# Patient Record
Sex: Female | Born: 1980 | Race: White | Hispanic: No | Marital: Single | Smoking: Former smoker
Health system: Southern US, Community
[De-identification: ages and names within clinical notes are randomized; demographics above are authoritative.]

## PROBLEM LIST (undated history)

## (undated) DIAGNOSIS — Z8742 Personal history of other diseases of the female genital tract: Secondary | ICD-10-CM

## (undated) DIAGNOSIS — E785 Hyperlipidemia, unspecified: Secondary | ICD-10-CM

## (undated) DIAGNOSIS — G8929 Other chronic pain: Secondary | ICD-10-CM

## (undated) DIAGNOSIS — F1193 Opioid use, unspecified with withdrawal: Secondary | ICD-10-CM

## (undated) DIAGNOSIS — R569 Unspecified convulsions: Secondary | ICD-10-CM

## (undated) DIAGNOSIS — E282 Polycystic ovarian syndrome: Secondary | ICD-10-CM

## (undated) DIAGNOSIS — M542 Cervicalgia: Secondary | ICD-10-CM

## (undated) DIAGNOSIS — N739 Female pelvic inflammatory disease, unspecified: Secondary | ICD-10-CM

## (undated) DIAGNOSIS — F419 Anxiety disorder, unspecified: Secondary | ICD-10-CM

## (undated) HISTORY — DX: Female pelvic inflammatory disease, unspecified: N73.9

## (undated) HISTORY — DX: Hyperlipidemia, unspecified: E78.5

## (undated) HISTORY — DX: Polycystic ovarian syndrome: E28.2

## (undated) HISTORY — DX: Unspecified convulsions: R56.9

## (undated) HISTORY — DX: Personal history of other diseases of the female genital tract: Z87.42

## (undated) HISTORY — PX: OTHER SURGICAL HISTORY: SHX169

---
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
## 2001-02-21 ENCOUNTER — Other Ambulatory Visit: Admission: RE | Admit: 2001-02-21 | Discharge: 2001-02-21 | Payer: Self-pay | Admitting: Obstetrics and Gynecology

## 2002-03-27 ENCOUNTER — Other Ambulatory Visit: Admission: RE | Admit: 2002-03-27 | Discharge: 2002-03-27 | Payer: Self-pay | Admitting: Obstetrics and Gynecology

## 2002-05-12 ENCOUNTER — Encounter (INDEPENDENT_AMBULATORY_CARE_PROVIDER_SITE_OTHER): Payer: Self-pay | Admitting: Specialist

## 2002-05-12 ENCOUNTER — Ambulatory Visit (HOSPITAL_COMMUNITY): Admission: RE | Admit: 2002-05-12 | Discharge: 2002-05-12 | Payer: Self-pay | Admitting: Obstetrics and Gynecology

## 2002-12-18 ENCOUNTER — Other Ambulatory Visit: Admission: RE | Admit: 2002-12-18 | Discharge: 2002-12-18 | Payer: Self-pay | Admitting: Obstetrics and Gynecology

## 2003-08-03 ENCOUNTER — Other Ambulatory Visit: Admission: RE | Admit: 2003-08-03 | Discharge: 2003-08-03 | Payer: Self-pay | Admitting: Obstetrics and Gynecology

## 2004-01-05 ENCOUNTER — Ambulatory Visit: Payer: Self-pay | Admitting: Internal Medicine

## 2004-01-22 ENCOUNTER — Ambulatory Visit: Payer: Self-pay | Admitting: Family Medicine

## 2004-03-01 ENCOUNTER — Ambulatory Visit: Payer: Self-pay | Admitting: Family Medicine

## 2004-03-14 ENCOUNTER — Other Ambulatory Visit: Admission: RE | Admit: 2004-03-14 | Discharge: 2004-03-14 | Payer: Self-pay | Admitting: Obstetrics and Gynecology

## 2004-03-15 ENCOUNTER — Other Ambulatory Visit: Admission: RE | Admit: 2004-03-15 | Discharge: 2004-03-15 | Payer: Self-pay | Admitting: Obstetrics and Gynecology

## 2004-04-19 ENCOUNTER — Ambulatory Visit (HOSPITAL_COMMUNITY): Admission: AD | Admit: 2004-04-19 | Discharge: 2004-04-19 | Payer: Self-pay | Admitting: Obstetrics & Gynecology

## 2004-04-19 ENCOUNTER — Encounter (INDEPENDENT_AMBULATORY_CARE_PROVIDER_SITE_OTHER): Payer: Self-pay | Admitting: *Deleted

## 2004-06-13 ENCOUNTER — Ambulatory Visit: Payer: Self-pay | Admitting: Family Medicine

## 2004-08-18 ENCOUNTER — Ambulatory Visit (HOSPITAL_COMMUNITY): Admission: RE | Admit: 2004-08-18 | Discharge: 2004-08-18 | Payer: Self-pay | Admitting: Family Medicine

## 2004-09-19 ENCOUNTER — Ambulatory Visit: Payer: Self-pay | Admitting: Family Medicine

## 2004-09-27 ENCOUNTER — Ambulatory Visit: Payer: Self-pay | Admitting: Family Medicine

## 2004-10-21 ENCOUNTER — Ambulatory Visit: Payer: Self-pay | Admitting: Family Medicine

## 2004-11-12 ENCOUNTER — Emergency Department (HOSPITAL_COMMUNITY): Admission: EM | Admit: 2004-11-12 | Discharge: 2004-11-12 | Payer: Self-pay | Admitting: Family Medicine

## 2005-01-01 ENCOUNTER — Emergency Department (HOSPITAL_COMMUNITY): Admission: EM | Admit: 2005-01-01 | Discharge: 2005-01-01 | Payer: Self-pay | Admitting: Emergency Medicine

## 2005-01-04 ENCOUNTER — Ambulatory Visit: Payer: Self-pay | Admitting: Family Medicine

## 2005-01-18 ENCOUNTER — Emergency Department (HOSPITAL_COMMUNITY): Admission: EM | Admit: 2005-01-18 | Discharge: 2005-01-18 | Payer: Self-pay | Admitting: Family Medicine

## 2005-01-23 ENCOUNTER — Ambulatory Visit: Payer: Self-pay | Admitting: Pain Medicine

## 2005-02-06 HISTORY — PX: CERVICAL BIOPSY  W/ LOOP ELECTRODE EXCISION: SUR135

## 2005-02-21 ENCOUNTER — Ambulatory Visit: Payer: Self-pay | Admitting: Pain Medicine

## 2005-03-04 ENCOUNTER — Emergency Department (HOSPITAL_COMMUNITY): Admission: EM | Admit: 2005-03-04 | Discharge: 2005-03-04 | Payer: Self-pay | Admitting: Emergency Medicine

## 2005-03-16 ENCOUNTER — Ambulatory Visit: Payer: Self-pay | Admitting: Family Medicine

## 2005-05-21 ENCOUNTER — Emergency Department: Payer: Self-pay | Admitting: Unknown Physician Specialty

## 2005-05-24 ENCOUNTER — Emergency Department: Payer: Self-pay | Admitting: Internal Medicine

## 2005-06-07 ENCOUNTER — Encounter: Payer: Self-pay | Admitting: Emergency Medicine

## 2005-07-23 ENCOUNTER — Emergency Department (HOSPITAL_COMMUNITY): Admission: EM | Admit: 2005-07-23 | Discharge: 2005-07-23 | Payer: Self-pay | Admitting: Emergency Medicine

## 2005-10-18 ENCOUNTER — Inpatient Hospital Stay (HOSPITAL_COMMUNITY): Admission: AD | Admit: 2005-10-18 | Discharge: 2005-10-18 | Payer: Self-pay | Admitting: Obstetrics & Gynecology

## 2006-04-10 ENCOUNTER — Inpatient Hospital Stay (HOSPITAL_COMMUNITY): Admission: RE | Admit: 2006-04-10 | Discharge: 2006-04-13 | Payer: Self-pay | Admitting: Obstetrics and Gynecology

## 2006-05-02 ENCOUNTER — Encounter: Admission: RE | Admit: 2006-05-02 | Discharge: 2006-06-01 | Payer: Self-pay | Admitting: Obstetrics and Gynecology

## 2006-06-02 ENCOUNTER — Encounter: Admission: RE | Admit: 2006-06-02 | Discharge: 2006-07-01 | Payer: Self-pay | Admitting: Obstetrics and Gynecology

## 2006-07-02 ENCOUNTER — Encounter: Admission: RE | Admit: 2006-07-02 | Discharge: 2006-08-01 | Payer: Self-pay | Admitting: Obstetrics and Gynecology

## 2006-08-02 ENCOUNTER — Encounter: Admission: RE | Admit: 2006-08-02 | Discharge: 2006-08-31 | Payer: Self-pay | Admitting: Obstetrics and Gynecology

## 2006-09-01 ENCOUNTER — Encounter: Admission: RE | Admit: 2006-09-01 | Discharge: 2006-10-01 | Payer: Self-pay | Admitting: Obstetrics and Gynecology

## 2006-10-02 ENCOUNTER — Encounter: Admission: RE | Admit: 2006-10-02 | Discharge: 2006-11-01 | Payer: Self-pay | Admitting: Obstetrics and Gynecology

## 2006-10-24 ENCOUNTER — Encounter: Payer: Self-pay | Admitting: Family Medicine

## 2006-11-02 ENCOUNTER — Encounter: Admission: RE | Admit: 2006-11-02 | Discharge: 2006-12-01 | Payer: Self-pay | Admitting: Obstetrics and Gynecology

## 2006-12-02 ENCOUNTER — Encounter: Admission: RE | Admit: 2006-12-02 | Discharge: 2007-01-01 | Payer: Self-pay | Admitting: Obstetrics and Gynecology

## 2006-12-11 ENCOUNTER — Encounter: Payer: Self-pay | Admitting: Family Medicine

## 2006-12-12 ENCOUNTER — Ambulatory Visit: Payer: Self-pay | Admitting: Family Medicine

## 2006-12-12 DIAGNOSIS — E663 Overweight: Secondary | ICD-10-CM | POA: Insufficient documentation

## 2007-01-02 ENCOUNTER — Encounter: Admission: RE | Admit: 2007-01-02 | Discharge: 2007-01-31 | Payer: Self-pay | Admitting: Obstetrics and Gynecology

## 2007-01-10 ENCOUNTER — Encounter (INDEPENDENT_AMBULATORY_CARE_PROVIDER_SITE_OTHER): Payer: Self-pay | Admitting: *Deleted

## 2007-01-11 ENCOUNTER — Encounter (INDEPENDENT_AMBULATORY_CARE_PROVIDER_SITE_OTHER): Payer: Self-pay | Admitting: *Deleted

## 2007-02-01 ENCOUNTER — Encounter: Admission: RE | Admit: 2007-02-01 | Discharge: 2007-03-03 | Payer: Self-pay | Admitting: Obstetrics and Gynecology

## 2007-03-30 ENCOUNTER — Emergency Department (HOSPITAL_COMMUNITY): Admission: EM | Admit: 2007-03-30 | Discharge: 2007-03-30 | Payer: Self-pay | Admitting: Emergency Medicine

## 2009-03-08 ENCOUNTER — Ambulatory Visit: Payer: Self-pay | Admitting: Pain Medicine

## 2009-03-10 ENCOUNTER — Ambulatory Visit: Payer: Self-pay | Admitting: Pain Medicine

## 2009-03-23 ENCOUNTER — Ambulatory Visit: Payer: Self-pay | Admitting: Pain Medicine

## 2009-04-12 ENCOUNTER — Ambulatory Visit: Payer: Self-pay | Admitting: Pain Medicine

## 2010-06-24 NOTE — Discharge Summary (Signed)
NAMEERIAN, LARIVIERE NO.:  [id number]   MEDICAL RECORD NO.:  [id number]          PATIENT TYPE:  INP   LOCATION:  9107                          FACILITY:  [facility]   PHYSICIAN:  Malva Limes, M.D.    DATE OF BIRTH:  December 29, 1980   DATE OF ADMISSION:  04/10/2006  DATE OF DISCHARGE:  04/13/2006                               DISCHARGE SUMMARY   FINAL DIAGNOSES:  1. Intrauterine pregnancy at term.  2. Breech presentation.  3. Herpes simplex virus, type 2.   PROCEDURE:  Primary low transverse cesarean section.   SURGEON:  Carrington Clamp, M.D.   COMPLICATIONS:  None.   This 30 year old, G3, P0-0-1-0, presents at term for cesarean section  secondary to a known breech presentation.  This was confirmed by  ultrasound at bedside before the surgery. Her antepartum course up to  this point had been mostly uncomplicated.  She does have HSV, type 2,  and had been on Valtrex during her last trimester. She had a positive  group B strep culture obtained in our office at 36 weeks.  The patient  was taking to the operating room on April 10, 2006, by Dr. Carrington Clamp where the patient had a primary low transverse cesarean section  with the delivery of a 7 pound 0 ounce female infant with Apgars of  eight and nine.  This was performed for a known breech presentation  dictation. The delivery without complications.   The patient's postoperative course was benign without any significant  fevers. The patient was felt ready for discharge on postoperative day  #3. She was having some depressive symptoms.  She had been on Prozac  prior to the pregnancy, and, after discussing this with Dr. Dareen Piano,  decided she wanted to restart this.  He gave her a prescription for  Prozac 20 mg to start taking daily. Of course to call with any other  depressive symptoms, was also given Percocet one to two every 4-6 hours  as needed for the pain, continue her vitamins, and was to follow up in  our  office in 2 weeks to recheck or sooner if needed.   LABORATORY DATA ON DISCHARGE:  The patient had a hemoglobin of 11.2,  white blood cell count of 9.4, platelets of 175,000.      Leilani Able, P.A.-C.    ______________________________  Malva Limes, M.D.    MB/MEDQ  D:  05/09/2006  T:  05/09/2006  Job:  [id number]

## 2010-06-24 NOTE — Op Note (Signed)
[name], GROSS NO.:  [id number]   MEDICAL RECORD NO.:  [id number]          PATIENT TYPE:  INP   LOCATION:  9107                          FACILITY:  [facility]   PHYSICIAN:  Carrington Clamp, M.D. DATE OF BIRTH:  [birth year]   DATE OF PROCEDURE:  04/10/2006  DATE OF DISCHARGE:                               OPERATIVE REPORT   PREOPERATIVE DIAGNOSIS:  Term breech.   POSTOPERATIVE DIAGNOSIS:  Term breech.   PROCEDURE:  Primary low transverse cesarean section.   SURGEON:  Dr. Carrington Clamp   ASSISTANT:  None.   ANESTHESIA:  Spinal.   SPECIMENS:  None.   ESTIMATED BLOOD LOSS:  800 mL.   IV FLUIDS:  2800 mL.   URINE OUTPUT:  200 mL.   COMPLICATIONS:  None.   FINDINGS:  A female infant in the breech presentation; Apgars 8 and 9;  normal tubes, ovaries and uterus seen.   MEDICATIONS:  Pitocin and Ancef and 0.5% Marcaine.   COUNTS:  Correct x3.   TECHNIQUE:  After adequate spinal anesthesia was achieved, the patient  was prepped and draped in usual sterile fashion, dorsal supine position  with leftward tilt.  Then 2.5 mL of 0.5% Marcaine were injected into the  incision area to ensure adequate pain control.  A Pfannenstiel skin  incision was made with the scalpel and carried down to the fascia with  the Bovie cautery.  The fascia was incised in the midline with the  scalpel and carried in transverse curvilinear manner with the Mayo  scissors.  The fascia was reflected superiorly and inferiorly from the  rectus muscles and the rectus muscles split in the midline.   Bowel free portion of the peritoneum was entered into bluntly with the  hemostats, and then the peritoneum was incised in a superior and  inferior manner bluntly.  There was good visualization of the bowel and  the bladder.   The bladder blade was placed.  The vesicouterine fascia was tented up  and incised in a transverse curvilinear manner with Metzenbaum scissors.  The bladder flap  was created with blunt dissection and the bladder blade  replaced.  A 2 cm incision was made in the upper portion of the lower  uterine segment until clear fluid was noted on entry into amnion.  The  incision was stretched manually, and the baby was identified in the  footling breech presentation and delivered without complication.  The  baby was bulb suctioned.  The cord was clamped and cut and baby was  handed to awaiting pediatrics.   The cord bloods were obtained and the placenta sent to [facility].  The uterus was then exteriorized, wrapped in wet lap,  and cleared of all debris.  The uterine incision was closed with running  lock stitch of 0 Monocryl.  An imbricating layer of 0 Monocryl was  performed.  An additional stitch of figure-of-eight stitch was used to  ensure hemostasis.  The uterus was then reapproximated in the abdomen  and abdomen cleared of debris with irrigation.  The uterine incision was  reinspected and found to be  hemostatic.  The peritoneum was then closed  with a running stitch of 2-0 Vicryl.  The fascia was closed with running  stitch of 0 Vicryl.  The subcutaneous tissue was rendered hemostatic  with Bovie cautery, and the skin was closed with staples.  The patient  tolerated the procedure well and was returned to recovery room in stable  condition.      Carrington Clamp, M.D.  Electronically Signed     MH/MEDQ  D:  04/10/2006  T:  04/10/2006  Job:  [id number]

## 2010-10-30 ENCOUNTER — Encounter: Payer: Self-pay | Admitting: Family Medicine

## 2010-10-31 LAB — I-STAT 8, (EC8 V) (CONVERTED LAB)
Acid-base deficit: 1
BUN: 5 — ABNORMAL LOW
Bicarbonate: 23.7
Chloride: 105
Glucose, Bld: 112 — ABNORMAL HIGH
Potassium: 3.8
Sodium: 138
TCO2: 25
pCO2, Ven: 40.4 — ABNORMAL LOW
pH, Ven: 7.377 — ABNORMAL HIGH

## 2010-10-31 LAB — URINALYSIS, ROUTINE W REFLEX MICROSCOPIC
Bilirubin Urine: NEGATIVE
Ketones, ur: NEGATIVE
Specific Gravity, Urine: 1.008

## 2010-10-31 LAB — RAPID URINE DRUG SCREEN, HOSP PERFORMED: Cocaine: POSITIVE — AB

## 2010-10-31 LAB — ETHANOL: Alcohol, Ethyl (B): 7

## 2010-10-31 LAB — DIFFERENTIAL
Basophils Absolute: 0
Monocytes Absolute: 0.4
Monocytes Relative: 3
Neutrophils Relative %: 79 — ABNORMAL HIGH

## 2010-10-31 LAB — CBC
HCT: 43.5
MCHC: 33.6
MCV: 81.9
RDW: 13.6

## 2010-10-31 LAB — PREGNANCY, URINE: Preg Test, Ur: NEGATIVE

## 2010-10-31 LAB — BASIC METABOLIC PANEL: Sodium: 137

## 2010-11-03 ENCOUNTER — Ambulatory Visit (INDEPENDENT_AMBULATORY_CARE_PROVIDER_SITE_OTHER): Payer: Self-pay | Admitting: Family Medicine

## 2010-11-03 ENCOUNTER — Encounter: Payer: Self-pay | Admitting: Family Medicine

## 2010-11-03 ENCOUNTER — Other Ambulatory Visit: Payer: Self-pay | Admitting: Family Medicine

## 2010-11-03 VITALS — BP 146/87 | HR 82 | Ht 63.0 in | Wt 175.0 lb

## 2010-11-03 DIAGNOSIS — F419 Anxiety disorder, unspecified: Secondary | ICD-10-CM | POA: Insufficient documentation

## 2010-11-03 DIAGNOSIS — R5383 Other fatigue: Secondary | ICD-10-CM | POA: Insufficient documentation

## 2010-11-03 DIAGNOSIS — E663 Overweight: Secondary | ICD-10-CM

## 2010-11-03 DIAGNOSIS — F411 Generalized anxiety disorder: Secondary | ICD-10-CM

## 2010-11-03 DIAGNOSIS — R5381 Other malaise: Secondary | ICD-10-CM

## 2010-11-03 DIAGNOSIS — E785 Hyperlipidemia, unspecified: Secondary | ICD-10-CM | POA: Insufficient documentation

## 2010-11-03 MED ORDER — ALPRAZOLAM 1 MG PO TABS: 1.0000 mg | ORAL_TABLET | Freq: Every day | ORAL | Status: DC | PRN

## 2010-11-03 MED ORDER — PHENTERMINE HCL 37.5 MG PO CAPS: 37.5000 mg | ORAL_CAPSULE | ORAL | Status: DC

## 2010-11-03 MED ORDER — CITALOPRAM HYDROBROMIDE 40 MG PO TABS: 40.0000 mg | ORAL_TABLET | Freq: Every day | ORAL | Status: DC

## 2010-11-03 NOTE — Assessment & Plan Note (Signed)
Obesity . Will try phenteramine 37.5 1 po a day Return for PE and EKG in 1 month and metabolic lab Discussed about genetic testing as well

## 2010-11-03 NOTE — Assessment & Plan Note (Signed)
Renew xanax she only average once a day Recommend we cap the celexa at 40 mg po day

## 2010-11-03 NOTE — Progress Notes (Signed)
  Subjective:    Patient ID: Chelsea Hayes, female    DOB: 1980-04-10, 30 y.o.   MRN: [id number]  HPI  Patient is here for evaluation of her obesity. She states that her celexa does not seem to work any more. She had used cphenteramine years ago but realizes that she was not ready to exercise and modify her eating habits. She promises me she works out on her gym equipment 4-5 days a week burt has not been able to lose her truncal obesity.  Review of Systems  Constitutional: Positive for fatigue.  Respiratory: Negative.   Cardiovascular: Negative.   Psychiatric/Behavioral: The patient is nervous/anxious.    BP 146/87  Pulse 82  Ht 5\' 3"  (1.6 m)  Wt 175 lb (79.379 kg)  BMI 31.00 kg/m2 Allergies  Allergen Reactions  . Propoxyphene N-Acetaminophen     REACTION: nausea   History   Social History  . Marital Status: Single    Spouse Name: N/A    Number of Children: N/A  . Years of Education: N/A   Occupational History  . Not on file.   Social History Main Topics  . Smoking status: Current Everyday Smoker -- 0.5 packs/day    Types: Cigarettes  . Smokeless tobacco: Not on file  . Alcohol Use: Yes     occasional  . Drug Use: No  . 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   Other Topics Concern  . Not on file   Social History Narrative  . No narrative on file   Outpatient Encounter Prescriptions as of 11/03/2010  Medication Sig Dispense Refill  . ALPRAZolam (XANAX) 1 MG tablet Take 1 tablet (1 mg total) by mouth daily as needed.  90 tablet  1  . citalopram (CELEXA) 40 MG tablet Take 1 tablet (40 mg total) by mouth daily.  90 tablet  3  . DISCONTD: ALPRAZolam (XANAX) 1 MG tablet Take 1 mg by mouth 3 (three) times daily as needed.        Marland Kitchen DISCONTD: citalopram (CELEXA) 20 MG tablet Take 20 mg by mouth 3 (three) times daily.        Marland Kitchen levothyroxine (SYNTHROID, LEVOTHROID) 25 MCG tablet Take 25 mcg by mouth daily.        . Multiple  Vitamin (MULTIVITAMIN) tablet Take 1 tablet by mouth daily.        . phentermine 37.5 MG capsule Take 1 capsule (37.5 mg total) by mouth every morning.  30 capsule  0       Objective:   Physical Exam  Constitutional: She appears well-developed and well-nourished.       Obese Wf  HENT:  Head: Normocephalic and atraumatic.  Neck: Normal range of motion. Neck supple.  Cardiovascular: Normal rate, regular rhythm and normal heart sounds.   Pulmonary/Chest: Effort normal and breath sounds normal.          Assessment & Plan:  Patient has decline flu vacccine

## 2010-11-03 NOTE — Patient Instructions (Signed)
Return in 1 month for PE and EKG. Continue to exercise on a regular basis.

## 2010-11-03 NOTE — Assessment & Plan Note (Signed)
Will get metabolic labs TSH , Hga1C, and lipids.

## 2010-11-03 NOTE — Telephone Encounter (Signed)
Pharmacy/[facility] calling to see if they can change pt phentermine from caps to tablets since they do not have in stock,  There could possibly be a absorption diff with the tablets.   PLan:  Instructed pharmacy to either order for the pt or call another [facility] to see if they can get the caps for the pt. Chelsea Newcomer, LPN Domingo Dimes

## 2010-11-15 ENCOUNTER — Encounter: Payer: Self-pay | Admitting: Family Medicine

## 2010-11-15 ENCOUNTER — Ambulatory Visit (INDEPENDENT_AMBULATORY_CARE_PROVIDER_SITE_OTHER): Payer: Self-pay | Admitting: Family Medicine

## 2010-11-15 VITALS — BP 135/83 | HR 73 | Ht 63.0 in | Wt 180.0 lb

## 2010-11-15 DIAGNOSIS — F329 Major depressive disorder, single episode, unspecified: Secondary | ICD-10-CM

## 2010-11-15 DIAGNOSIS — G8921 Chronic pain due to trauma: Secondary | ICD-10-CM | POA: Insufficient documentation

## 2010-11-15 DIAGNOSIS — M549 Dorsalgia, unspecified: Secondary | ICD-10-CM

## 2010-11-15 MED ORDER — METHADONE HCL 10 MG PO TABS: 20.0000 mg | ORAL_TABLET | Freq: Three times a day (TID) | ORAL | Status: DC

## 2010-11-15 MED ORDER — DULOXETINE HCL 30 MG PO CPEP: 60.0000 mg | ORAL_CAPSULE | Freq: Every day | ORAL | Status: DC

## 2010-11-15 NOTE — Progress Notes (Signed)
Subjective:    Patient ID: Chelsea Hayes, female    DOB: Mar 14, 1980, 30 y.o.   MRN: [id number]  Back Pain This is a chronic problem. The current episode started in the past 7 days. The problem occurs 2 to 4 times per day. The problem has been waxing and waning since onset. The pain is present in the gluteal and sacro-iliac (Cspine as well). The quality of the pain is described as aching and stabbing. The symptoms are aggravated by bending, sitting, standing and position. Stiffness is present in the morning and all day. Risk factors include sedentary lifestyle, lack of exercise and obesity. She has tried analgesics, NSAIDs, muscle relaxant and home exercises for the symptoms. The treatment provided no relief.   Patient reports taking MEthadone from pain clinic. When she lived in [place] she got all her medication at one place and that is what she wants to do now. She has an appointment for a PE later this month.   Review of Systems  Musculoskeletal: Positive for back pain.   BP 135/83  Pulse 73  Ht 5\' 3"  (1.6 m)  Wt 180 lb (81.647 kg)  BMI 31.89 kg/m2  SpO2 96% Allergies  Allergen Reactions  . Propoxyphene N-Acetaminophen     REACTION: nausea  . Codeine    History   Social History  . Marital Status: Single    Spouse Name: N/A    Number of Children: N/A  . Years of Education: N/A   Occupational History  . Not on file.   Social History Main Topics  . Smoking status: Former Smoker -- 0.5 packs/day    Types: Cigarettes  . Smokeless tobacco: Not on file  . Alcohol Use: Yes     occasional  . Drug Use: No  . 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   Other Topics Concern  . Not on file   Social History Narrative  . No narrative on file        Objective:   Physical Exam  Constitutional: She is oriented to person, place, and time. She appears well-developed and well-nourished.  Cardiovascular: Normal rate.     Musculoskeletal: She exhibits tenderness.       Cervical back: She exhibits tenderness, pain and spasm.       Lumbar back: She exhibits tenderness, pain and spasm.  Neurological: She is alert and oriented to person, place, and time.  Skin: Skin is warm.          Assessment & Plan:  Due to chronic pain will add cymbalta to pain regimen and wean off celexa. Start cymbalta at 30mg  reduce celexa in half after 2 weks stop celexa and increase cymbalta to 2 capsules a day or 60 mg  follow up w/me as schedule end of the month. Outpatient Encounter Prescriptions as of 11/15/2010  Medication Sig Dispense Refill  . ALPRAZolam (XANAX) 1 MG tablet Take 1 tablet (1 mg total) by mouth daily as needed.  90 tablet  1  . citalopram (CELEXA) 40 MG tablet Take 1 tablet (40 mg total) by mouth daily.  90 tablet  3  . methadone (DOLOPHINE) 10 MG tablet Take 2 tablets (20 mg total) by mouth every 8 (eight) hours.  180 tablet  0  . Multiple Vitamin (MULTIVITAMIN) tablet Take 1 tablet by mouth daily.        . phentermine 37.5 MG capsule Take 1 capsule (37.5 mg total) by mouth  every morning.  30 capsule  0  . DISCONTD: methadone (DOLOPHINE) 10 MG tablet Take 10 mg by mouth daily.        . DULoxetine (CYMBALTA) 30 MG capsule Take 2 capsules (60 mg total) by mouth daily. Ist 2 weeks take 1 a day and 1/2 celexa dosage after 2 weeks increase to twice a day and stop celexa  60 capsule  2  . levothyroxine (SYNTHROID, LEVOTHROID) 25 MCG tablet Take 25 mcg by mouth daily.

## 2010-11-15 NOTE — Assessment & Plan Note (Signed)
Due to chronic pain will add cymbalta to pain regimen and wean off celexa. Start cymbalta at 30mg reduce celexa in half after 2 weks stop celexa and increase cymbalta to 2 capsules a day or 60 mg  follow up w/me as schedule end of the month. 

## 2010-11-15 NOTE — Patient Instructions (Signed)
Due to chronic pain will add cymbalta to pain regimen and wean off celexa. Start cymbalta at 30mg  reduce celexa in half after 2 weks stop celexa and increase cymbalta to 2 capsules a day or 60 mg  follow up w/me as schedule end of the month.

## 2010-12-01 ENCOUNTER — Encounter: Payer: Self-pay | Admitting: Family Medicine

## 2010-12-13 ENCOUNTER — Other Ambulatory Visit: Payer: Self-pay | Admitting: *Deleted

## 2010-12-13 MED ORDER — METHADONE HCL 10 MG PO TABS: 20.0000 mg | ORAL_TABLET | Freq: Three times a day (TID) | ORAL | Status: DC

## 2011-01-09 ENCOUNTER — Other Ambulatory Visit: Payer: Self-pay | Admitting: *Deleted

## 2011-01-09 NOTE — Telephone Encounter (Signed)
Dr. Thurmond Butts can you refill this for her

## 2011-01-09 NOTE — Telephone Encounter (Signed)
Pt calls and would like a refill on her Methadone. Her husbands father passed away and they will be traveling to [place] in the morning and would like to pick up today if possible

## 2011-01-09 NOTE — Telephone Encounter (Signed)
I don't rx methadone.  She will have to wait until Dr. Thurmond Butts is back.

## 2011-01-10 MED ORDER — METHADONE HCL 10 MG PO TABS: 20.0000 mg | ORAL_TABLET | Freq: Three times a day (TID) | ORAL | Status: DC

## 2011-01-10 NOTE — Telephone Encounter (Signed)
I have some reservation since she was to have a PE at the the end of October and then did not return in November . She may have 30 but no more until she has that complete PE.

## 2011-01-12 ENCOUNTER — Ambulatory Visit (INDEPENDENT_AMBULATORY_CARE_PROVIDER_SITE_OTHER): Payer: Self-pay | Admitting: Family Medicine

## 2011-01-12 ENCOUNTER — Encounter: Payer: Self-pay | Admitting: Family Medicine

## 2011-01-12 VITALS — BP 137/88 | HR 86 | Ht 63.0 in | Wt 181.0 lb

## 2011-01-12 DIAGNOSIS — Z0000 Encounter for general adult medical examination without abnormal findings: Secondary | ICD-10-CM

## 2011-01-12 DIAGNOSIS — F329 Major depressive disorder, single episode, unspecified: Secondary | ICD-10-CM

## 2011-01-12 DIAGNOSIS — Z283 Underimmunization status: Secondary | ICD-10-CM

## 2011-01-12 DIAGNOSIS — E669 Obesity, unspecified: Secondary | ICD-10-CM

## 2011-01-12 DIAGNOSIS — E785 Hyperlipidemia, unspecified: Secondary | ICD-10-CM

## 2011-01-12 DIAGNOSIS — E663 Overweight: Secondary | ICD-10-CM

## 2011-01-12 DIAGNOSIS — G8929 Other chronic pain: Secondary | ICD-10-CM

## 2011-01-12 DIAGNOSIS — M549 Dorsalgia, unspecified: Secondary | ICD-10-CM

## 2011-01-12 LAB — POCT URINALYSIS DIPSTICK
Bilirubin, UA: NEGATIVE
Glucose, UA: NEGATIVE
Leukocytes, UA: NEGATIVE
Nitrite, UA: NEGATIVE
Protein, UA: NEGATIVE
Spec Grav, UA: >=1.03
Urobilinogen, UA: 0.2
pH, UA: 5.5

## 2011-01-12 MED ORDER — PHENTERMINE HCL 37.5 MG PO CAPS: 37.5000 mg | ORAL_CAPSULE | ORAL | Status: DC

## 2011-01-12 MED ORDER — METHADONE HCL 10 MG PO TABS: 10.0000 mg | ORAL_TABLET | Freq: Three times a day (TID) | ORAL | Status: DC

## 2011-01-12 NOTE — Patient Instructions (Signed)
obesityHypertriglyceridemia  Diet for High blood levels of Triglycerides Most fats in food are triglycerides. Triglycerides in your blood are stored as fat in your body. High levels of triglycerides in your blood may put you at a greater risk for heart disease and stroke.  Normal triglyceride levels are less than 150 mg/dL. Borderline high levels are 150-199 mg/dl. High levels are 200 - 499 mg/dL, and very high triglyceride levels are greater than 500 mg/dL. The decision to treat high triglycerides is generally based on the level. For people with borderline or high triglyceride levels, treatment includes weight loss and exercise. Drugs are recommended for people with very high triglyceride levels. Many people who need treatment for high triglyceride levels have metabolic syndrome. This syndrome is a collection of disorders that often include: insulin resistance, high blood pressure, blood clotting problems, high cholesterol and triglycerides. TESTING PROCEDURE FOR TRIGLYCERIDES You should not eat 4 hours before getting your triglycerides measured. The normal range of triglycerides is between 10 and 250 milligrams per deciliter (mg/dl). Some people may have extreme levels (1000 or above), but your triglyceride level may be too high if it is above 150 mg/dl, depending on what other risk factors you have for heart disease.  People with high blood triglycerides may also have high blood cholesterol levels. If you have high blood cholesterol as well as high blood triglycerides, your risk for heart disease is probably greater than if you only had high triglycerides. High blood cholesterol is one of the main risk factors for heart disease.  CHANGING YOUR DIET  Your weight can affect your blood triglyceride level. If you are more than 20% above your ideal body weight, you may be able to lower your blood triglycerides by losing weight. Eating less and exercising regularly is the best way to combat this. Fat provides  more calories than any other food. The best way to lose weight is to eat less fat. Only 30% of your total calories should come from fat. Less than 7% of your diet should come from saturated fat. A diet low in fat and saturated fat is the same as a diet to decrease blood cholesterol. By eating a diet lower in fat, you may lose weight, lower your blood cholesterol, and lower your blood triglyceride level.  Eating a diet low in fat, especially saturated fat, may also help you lower your blood triglyceride level. Ask your dietitian to help you figure how much fat you can eat based on the number of calories your caregiver has prescribed for you.  Exercise, in addition to helping with weight loss may also help lower triglyceride levels.  Alcohol can increase blood triglycerides. You may need to stop drinking alcoholic beverages.  Too much carbohydrate in your diet may also increase your blood triglycerides. Some complex carbohydrates are necessary in your diet. These may include bread, rice, potatoes, other starchy vegetables and cereals.  Reduce "simple" carbohydrates. These may include pure sugars, candy, honey, and jelly without losing other nutrients. If you have the kind of high blood triglycerides that is affected by the amount of carbohydrates in your diet, you will need to eat less sugar and less high-sugar foods. Your caregiver can help you with this.  Adding 2-4 grams of fish oil (EPA+ DHA) may also help lower triglycerides. Speak with your caregiver before adding any supplements to your regimen.  Following the Diet  Maintain your ideal weight. Your caregivers can help you with a diet. Generally, eating less food and getting more  exercise will help you lose weight. Joining a weight control group may also help. Ask your caregivers for a good weight control group in your area.  Eat low-fat foods instead of high-fat foods. This can help you lose weight too.  These foods are lower in fat. Eat MORE of  these:  Dried beans, peas, and lentils.  Egg whites.  Low-fat cottage cheese.  Fish.  Lean cuts of meat, such as round, sirloin, rump, and flank (cut extra fat off meat you fix).  Whole grain breads, cereals and pasta.  Skim and nonfat dry milk.  Low-fat yogurt.  Poultry without the skin.  Cheese made with skim or part-skim milk, such as mozzarella, parmesan, farmers', ricotta, or pot cheese.  These are higher fat foods. Eat LESS of these:  Whole milk and foods made from whole milk, such as American, blue, cheddar, monterey jack, and swiss cheese  High-fat meats, such as luncheon meats, sausages, knockwurst, bratwurst, hot dogs, ribs, corned beef, ground pork, and regular ground beef.  Fried foods.  Limit saturated fats in your diet. Substituting unsaturated fat for saturated fat may decrease your blood triglyceride level. You will need to read package labels to know which products contain saturated fats.  These foods are high in saturated fat. Eat LESS of these:  Fried pork skins.  Whole milk.  Skin and fat from poultry.  Palm oil.  Butter.  Shortening.  Cream cheese.  Tomasa Blase.  Margarines and baked goods made from listed oils.  Vegetable shortenings.  Chitterlings.  Fat from meats.  Coconut oil.  Palm kernel oil.  Lard.  Cream.  Sour cream.  Fatback.  Coffee whiteners and non-dairy creamers made with these oils.  Cheese made from whole milk.  Use unsaturated fats (both polyunsaturated and monounsaturated) moderately. Remember, even though unsaturated fats are better than saturated fats; you still want a diet low in total fat.  These foods are high in unsaturated fat:  Canola oil.  Sunflower oil.  Mayonnaise.  Almonds.  Peanuts.  Pine nuts.  Margarines made with these oils.  Safflower oil.  Olive oil.  Avocados.  Cashews.  Peanut butter.  Sunflower seeds.  Soybean oil.  Peanut oil.  Olives.  Pecans.  Walnuts.  Pumpkin seeds.  Avoid sugar and other high-sugar  foods. This will decrease carbohydrates without decreasing other nutrients. Sugar in your food goes rapidly to your blood. When there is excess sugar in your blood, your liver may use it to make more triglycerides. Sugar also contains calories without other important nutrients.  Eat LESS of these:  Sugar, brown sugar, powdered sugar, jam, jelly, preserves, honey, syrup, molasses, pies, candy, cakes, cookies, frosting, pastries, colas, soft drinks, punches, fruit drinks, and regular gelatin.  Avoid alcohol. Alcohol, even more than sugar, may increase blood triglycerides. In addition, alcohol is high in calories and low in nutrients. Ask for sparkling water, or a diet soft drink instead of an alcoholic beverage.  Suggestions for planning and preparing meals  Bake, broil, grill or roast meats instead of frying.  Remove fat from meats and skin from poultry before cooking.  Add spices, herbs, lemon juice or vinegar to vegetables instead of salt, rich sauces or gravies.  Use a non-stick skillet without fat or use no-stick sprays.  Cool and refrigerate stews and broth. Then remove the hardened fat floating on the surface before serving.  Refrigerate meat drippings and skim off fat to make low-fat gravies.  Serve more fish.  Use less butter, margarine  and other high-fat spreads on bread or vegetables.  Use skim or reconstituted non-fat dry milk for cooking.  Cook with low-fat cheeses.  Substitute low-fat yogurt or cottage cheese for all or part of the sour cream in recipes for sauces, dips or congealed salads.  Use half yogurt/half mayonnaise in salad recipes.  Substitute evaporated skim milk for cream. Evaporated skim milk or reconstituted non-fat dry milk can be whipped and substituted for whipped cream in certain recipes.  Choose fresh fruits for dessert instead of high-fat foods such as pies or cakes. Fruits are naturally low in fat.  When Dining Out  Order low-fat appetizers such as fruit or  vegetable juice, pasta with vegetables or tomato sauce.  Select clear, rather than cream soups.  Ask that dressings and gravies be served on the side. Then use less of them.  Order foods that are baked, broiled, poached, steamed, stir-fried, or roasted.  Ask for margarine instead of butter, and use only a small amount.  Drink sparkling water, unsweetened tea or coffee, or diet soft drinks instead of alcohol or other sweet beverages.  QUESTIONS AND ANSWERS ABOUT OTHER FATS IN THE BLOOD: SATURATED FAT, TRANS FAT, AND CHOLESTEROL What is trans fat? Trans fat is a type of fat that is formed when vegetable oil is hardened through a process called hydrogenation. This process helps makes foods more solid, gives them shape, and prolongs their shelf life. Trans fats are also called hydrogenated or partially hydrogenated oils.  What do saturated fat, trans fat, and cholesterol in foods have to do with heart disease? Saturated fat, trans fat, and cholesterol in the diet all raise the level of LDL "bad" cholesterol in the blood. The higher the LDL cholesterol, the greater the risk for coronary heart disease (CHD). Saturated fat and trans fat raise LDL similarly.  What foods contain saturated fat, trans fat, and cholesterol? High amounts of saturated fat are found in animal products, such as fatty cuts of meat, chicken skin, and full-fat dairy products like butter, whole milk, cream, and cheese, and in tropical vegetable oils such as palm, palm kernel, and coconut oil. Trans fat is found in some of the same foods as saturated fat, such as vegetable shortening, some margarines (especially hard or stick margarine), crackers, cookies, baked goods, fried foods, salad dressings, and other processed foods made with partially hydrogenated vegetable oils. Small amounts of trans fat also occur naturally in some animal products, such as milk products, beef, and lamb. Foods high in cholesterol include liver, other organ meats,  egg yolks, shrimp, and full-fat dairy products. How can I use the new food label to make heart-healthy food choices? Check the Nutrition Facts panel of the food label. Choose foods lower in saturated fat, trans fat, and cholesterol. For saturated fat and cholesterol, you can also use the Percent Daily Value (%DV): 5% DV or less is low, and 20% DV or more is high. (There is no %DV for trans fat.) Use the Nutrition Facts panel to choose foods low in saturated fat and cholesterol, and if the trans fat is not listed, read the ingredients and limit products that list shortening or hydrogenated or partially hydrogenated vegetable oil, which tend to be high in trans fat. POINTS TO REMEMBER: YOU NEED A LITTLE TLC (THERAPEUTIC LIFESTYLE CHANGES) Discuss your risk for heart disease with your caregivers, and take steps to reduce risk factors.  Change your diet. Choose foods that are low in saturated fat, trans fat, and cholesterol.  Add  exercise to your daily routine if it is not already being done. Participate in physical activity of moderate intensity, like brisk walking, for at least 30 minutes on most, and preferably all days of the week. No time? Break the 30 minutes into three, 10-minute segments during the day.  Stop smoking. If you do smoke, contact your caregiver to discuss ways in which they can help you quit.  Do not use street drugs.  Maintain a normal weight.  Maintain a healthy blood pressure.  Keep up with your blood work for checking the fats in your blood as directed by your caregiver.  Document Released: 11/11/2003 Document Revised: 10/05/2010 Document Reviewed: 06/08/2008 [facility] Patient Information 2012 [place], [place].Obesity Obesity is defined as having a body mass index (BMI) of 30 or more. To calculate your BMI divide your weight in pounds by your height in inches squared and multiply that product by 703. Major illnesses resulting from long-term obesity include:  Stroke.   Heart  disease.   Diabetes.   Many cancers.   Arthritis.  Obesity also complicates recovery from many other medical problems.  CAUSES   A history of obesity in your parents.   Thyroid hormone imbalance.   Environmental factors such as excess calorie intake and physical inactivity.  TREATMENT  A healthy weight loss program includes:  A calorie restricted diet based on individual calorie needs.   Increased physical activity (exercise).  An exercise program is just as important as the right low-calorie diet.  Weight-loss medicines should be used only under the supervision of your physician. These medicines help, but only if they are used with diet and exercise programs. Medicines can have side effects including nervousness, nausea, abdominal pain, diarrhea, headache, drowsiness, and depression.  An unhealthy weight loss program includes:  Fasting.   Fad diets.   Supplements and drugs.  These choices do not succeed in long-term weight control.  HOME CARE INSTRUCTIONS  To help you make the needed dietary changes:   Exercise and perform physical activity as directed by your caregiver.   Keep a daily record of everything you eat. There are many free websites to help you with this. It may be helpful to measure your foods so you can determine if you are eating the correct portion sizes.   Use low-calorie cookbooks or take special cooking classes.   Avoid alcohol. Drink more water and drinks with no calories.   Take vitamins and supplements only as recommended by your caregiver.   Weight loss support groups, Registered Dieticians, counselors, and stress reduction education can also be very helpful.  Document Released: 03/02/2004 Document Revised: 10/05/2010 Document Reviewed: 12/30/2006 [facility] Patient Information 2012 [place], [place].Exercise to Lose Weight Exercise and a healthy diet may help you lose weight. Your doctor may suggest specific exercises. EXERCISE IDEAS AND  TIPS  Choose low-cost things you enjoy doing, such as walking, bicycling, or exercising to workout videos.   Take stairs instead of the elevator.   Walk during your lunch break.   Park your car further away from work or school.   Go to a gym or an exercise class.   Start with 5 to 10 minutes of exercise each day. Build up to 30 minutes of exercise 4 to 6 days a week.   Wear shoes with good support and comfortable clothes.   Stretch before and after working out.   Work out until you breathe harder and your heart beats faster.   Drink extra water when you exercise.  Do not do so much that you hurt yourself, feel dizzy, or get very short of breath.  Exercises that burn about 150 calories:  Running 1  miles in 15 minutes.   Playing volleyball for 45 to 60 minutes.   Washing and waxing a car for 45 to 60 minutes.   Playing touch football for 45 minutes.   Walking 1  miles in 35 minutes.   Pushing a stroller 1  miles in 30 minutes.   Playing basketball for 30 minutes.   Raking leaves for 30 minutes.   Bicycling 5 miles in 30 minutes.   Walking 2 miles in 30 minutes.   Dancing for 30 minutes.   Shoveling snow for 15 minutes.   Swimming laps for 20 minutes.   Walking up stairs for 15 minutes.   Bicycling 4 miles in 15 minutes.   Gardening for 30 to 45 minutes.   Jumping rope for 15 minutes.   Washing windows or floors for 45 to 60 minutes.  Document Released: 02/25/2010 Document Revised: 10/05/2010 Document Reviewed: 02/25/2010 [facility] Patient Information 2012 [place], [place].
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
## 2011-01-13 ENCOUNTER — Telehealth: Payer: Self-pay | Admitting: *Deleted

## 2011-01-13 DIAGNOSIS — G8929 Other chronic pain: Secondary | ICD-10-CM

## 2011-01-13 NOTE — Telephone Encounter (Signed)
She was right it was to be for 150 more or a total of 180. Does she want to bring that scrip back for a new scrip of 150 or  A 3rd scrip for 120. I will sign the scrip Tuesday when I return.

## 2011-01-13 NOTE — Telephone Encounter (Signed)
Pt calls and states yesterday at her appointment that you only gasve her #30 Methadone and you all had discussed this and she was supposed to get the full amount.

## 2011-01-15 DIAGNOSIS — M549 Dorsalgia, unspecified: Secondary | ICD-10-CM | POA: Insufficient documentation

## 2011-01-15 DIAGNOSIS — F32A Depression, unspecified: Secondary | ICD-10-CM | POA: Insufficient documentation

## 2011-01-15 DIAGNOSIS — F329 Major depressive disorder, single episode, unspecified: Secondary | ICD-10-CM | POA: Insufficient documentation

## 2011-01-15 DIAGNOSIS — E669 Obesity, unspecified: Secondary | ICD-10-CM | POA: Insufficient documentation

## 2011-01-16 NOTE — Telephone Encounter (Signed)
She filled what you gave her so I guess #120. Itold her you would not be back until Tuesday. So if ok I will print Rx and give to Marcelino Duster to hold until Tuesday

## 2011-01-17 MED ORDER — METHADONE HCL 10 MG PO TABS: 10.0000 mg | ORAL_TABLET | Freq: Three times a day (TID) | ORAL | Status: DC

## 2011-02-09 ENCOUNTER — Ambulatory Visit (INDEPENDENT_AMBULATORY_CARE_PROVIDER_SITE_OTHER): Payer: Self-pay | Admitting: Family Medicine

## 2011-02-09 ENCOUNTER — Telehealth: Payer: Self-pay | Admitting: *Deleted

## 2011-02-09 ENCOUNTER — Encounter: Payer: Self-pay | Admitting: Family Medicine

## 2011-02-09 VITALS — BP 130/81 | HR 85 | Ht 63.0 in | Wt 179.0 lb

## 2011-02-09 DIAGNOSIS — Z0000 Encounter for general adult medical examination without abnormal findings: Secondary | ICD-10-CM

## 2011-02-09 DIAGNOSIS — E669 Obesity, unspecified: Secondary | ICD-10-CM

## 2011-02-09 DIAGNOSIS — G8929 Other chronic pain: Secondary | ICD-10-CM

## 2011-02-09 DIAGNOSIS — F3160 Bipolar disorder, current episode mixed, unspecified: Secondary | ICD-10-CM

## 2011-02-09 DIAGNOSIS — M549 Dorsalgia, unspecified: Secondary | ICD-10-CM | POA: Insufficient documentation

## 2011-02-09 MED ORDER — ARIPIPRAZOLE 10 MG PO TABS: 10.0000 mg | ORAL_TABLET | Freq: Every day | ORAL | Status: DC

## 2011-02-09 MED ORDER — CARBAMAZEPINE 200 MG PO TABS: ORAL_TABLET | ORAL | Status: DC

## 2011-02-09 MED ORDER — PHENTERMINE HCL 37.5 MG PO CAPS: 37.5000 mg | ORAL_CAPSULE | ORAL | Status: DC

## 2011-02-09 MED ORDER — METHADONE HCL 10 MG PO TABS: 20.0000 mg | ORAL_TABLET | Freq: Three times a day (TID) | ORAL | Status: DC

## 2011-02-09 NOTE — Telephone Encounter (Signed)
Pt calls back and states that she can not afford the Abilify- cost is 600.00 dollars and she has no insurance. Wants to know if you can change it to something cheaper

## 2011-02-09 NOTE — Telephone Encounter (Signed)
Pt would also like to know does she need to wean off the Celexa and if so how or does she need to stay on it

## 2011-02-09 NOTE — Patient Instructions (Signed)
Manic Depression (Bipolar Disorder)  Bipolar disorder is also known as manic depressive illness. It is when the brain does not function properly and causes shifts in a person's moods, energy and ability to function in everyday life. These shifts are different from the normal ups and downs that everyone experiences. Instead the shifts are severe. If this goes untreated, the person's life becomes more and more disorderly. People with this disorder can be treated can lead full and productive lives. This disorder must be managed throughout life.   SYMPTOMS    Bipolar disorder causes dramatic mood swings. These mood swings go in cycles. They cycle from extreme "highs" and irritable to deep "lows" of sadness and hopelessness.   Between the extreme moods, there are usually periods of normal mood.   Along with the mood shifts, the person will have severe changes in energy and behavior. The periods of "highs" and "lows" are called episodes of mania and depression.  Signs of mania:   Lots of energy, activity and restlessness.   Extreme "high" or good mood.   Extreme irritability.   Racing thoughts and talking very fast.   Jumping from one idea to another.   Not able to focus, easily distracted.   Little need to sleep.   Grand beliefs in one's abilities and powers.   Spending sprees.   Increased sexual drive. This can result in many sexual partners.   Poor judgment.   Abuse of drugs, particularly cocaine, alcohol, and sleeping medication.   Aggressive or provocative behavior.   A lasting period of behavior that is different from usual.   Denial that anything is wrong.  *A manic episode is identified if a "high" mood happens with three or more of the other symptoms lasting most of the day, nearly everyday for a week or longer. If the mood is more irritable in nature, four additional symptoms must be present.  Signs of depression:   Lasting feelings of sadness, anxiety, or empty mood.   Feelings of  hopelessness with negative thoughts.   Feelings of guilt, worthlessness, or helplessness.   Loss of interest or pleasure in activities once enjoyed, including sex.   Feelings of fatigue or having less energy.   Trouble focusing, making decisions, remembering.   Feeling restless or irritable.   Sleeping too little or too much.   Change in eating with possible weight gain or loss.   Feeling ongoing pain that is not caused by physical illness or injury.   Thoughts of death or suicide or suicide attempts.  *A depressive episode is identified as having five or more of the above symptoms that last most of the day, nearly everyday for two weeks or longer.  CAUSES    Research shows that there is no single cause for the disorder. Many factors act together to produce the illness.   This can be passed down from family (hereditary).   Environment may play a part.  TREATMENT    Long-term treatment is strongly recommended because bipolar disorder is a repeated illness. This disorder is better controlled if treatment is ongoing than if it is off and on.   A combination of medication and talk therapy is best for managing the disorder over time.   Medication.   Medication can be prescribed by a doctor that is an expert in treating mental disorders (psychiatrists). Medications known as "mood stabilizers" are usually prescribed to help control the illness. Other medications can be added when needed. These medicines usually treat episodes   mood stabilizer.   Talk Therapy.   Along with medication, some forms of talk therapy are helpful in providing support, education and guidance to people with the illness and their families. Studies show that this type of treatment increases mood stability, decreases need for hospitalization and improves how they function society.   Electroconvulsive Therapy (ECT).   In extreme situations where  the above treatments do not work or work too slowly to relieve severe symptoms, ECT may be considered.  Document Released: 05/01/2000 Document Revised: 10/05/2010 Document Reviewed: 12/21/2006 [facility] Patient Information 2012 [place], [place].

## 2011-02-09 NOTE — Telephone Encounter (Signed)
Since she has used tegretol before my first suggestion was to go back to tegretol first. I would start her at 200 mg twice a day and increase her by 200 mg every 2 weeks until she has noticed a difference or is at a maximum of 1200 a day. Please call in 120 of the 200 mg tablets

## 2011-02-11 NOTE — Telephone Encounter (Signed)
As I told her on Thursday she should take both the tegretol and the celexa.

## 2011-02-11 NOTE — Progress Notes (Signed)
Subjective:     Patient ID: Chelsea Hayes, female   DOB: [birth year], 31 y.o.   MRN: [id number]  HPI #1Obesity  #2 back pain  #3 concern over bipolar DX  States she has had mood swings and feels as if at time she maybe manic. She is on celexa and states it no longer seems effective and she has taken 60 mg before and feels better. She has also taken tegretol before for seizures  Review of Systems    BP 130/81  Pulse 85  Ht 5\' 3"  (1.6 m)  Wt 179 lb (81.194 kg)  BMI 31.71 kg/m2  SpO2 96%  Objective:   Physical Exam WNWD WF 2 lb weight loss    Assessment:     #1 obesity #2 back pain  #3 possible Bipolar   Plan:      #1 Renew phentermine #2 renew metadone #3 offer to refer back to psychologist but she declines due to cost Since she has used tegretol before we could add to the celexa  Or we could use a low dose of abilify w/ the celexa.  She ask about lithium wich is a possibility but she willl definetly need to see a psychiatrist for initial treatment    Return in 1 month

## 2011-02-13 ENCOUNTER — Telehealth: Payer: Self-pay | Admitting: *Deleted

## 2011-02-13 NOTE — Telephone Encounter (Signed)
Pt states she has only taken Tegretol once or twice and it is making her nauseated. She will give it a couple more days. Please advise.  MA, LPN

## 2011-02-13 NOTE — Telephone Encounter (Signed)
Lm for pt with instructions to take meds.  MA, LPN

## 2011-02-14 ENCOUNTER — Ambulatory Visit: Payer: Self-pay | Admitting: Family Medicine

## 2011-02-14 NOTE — Telephone Encounter (Signed)
At this point since it has come out of the blue about the bipolar DX I strongly suggest since i thought that she had taken this medication before if she does not tolerate the current medication we probably need to have her see the psychiatrist a few times to establish dosages and get to the right medication.

## 2011-02-14 NOTE — Telephone Encounter (Signed)
Pt notified and will call her psych to get appt. KJ LPN

## 2011-02-16 ENCOUNTER — Encounter: Payer: Self-pay | Admitting: Family Medicine

## 2011-03-09 ENCOUNTER — Ambulatory Visit (INDEPENDENT_AMBULATORY_CARE_PROVIDER_SITE_OTHER): Payer: Self-pay | Admitting: Family Medicine

## 2011-03-09 ENCOUNTER — Encounter: Payer: Self-pay | Admitting: Family Medicine

## 2011-03-09 VITALS — BP 139/89 | HR 98 | Ht 63.0 in | Wt 174.0 lb

## 2011-03-09 DIAGNOSIS — E669 Obesity, unspecified: Secondary | ICD-10-CM

## 2011-03-09 DIAGNOSIS — M549 Dorsalgia, unspecified: Secondary | ICD-10-CM

## 2011-03-09 DIAGNOSIS — G8929 Other chronic pain: Secondary | ICD-10-CM

## 2011-03-09 DIAGNOSIS — Z0000 Encounter for general adult medical examination without abnormal findings: Secondary | ICD-10-CM

## 2011-03-09 DIAGNOSIS — F3132 Bipolar disorder, current episode depressed, moderate: Secondary | ICD-10-CM

## 2011-03-09 MED ORDER — MELOXICAM 15 MG PO TABS: 15.0000 mg | ORAL_TABLET | Freq: Every day | ORAL | Status: DC

## 2011-03-09 MED ORDER — CARBAMAZEPINE 200 MG PO TABS: 200.0000 mg | ORAL_TABLET | Freq: Two times a day (BID) | ORAL | Status: DC

## 2011-03-09 MED ORDER — PHENTERMINE HCL 37.5 MG PO CAPS: 37.5000 mg | ORAL_CAPSULE | ORAL | Status: DC

## 2011-03-09 MED ORDER — METHADONE HCL 10 MG PO TABS: 20.0000 mg | ORAL_TABLET | Freq: Three times a day (TID) | ORAL | Status: DC

## 2011-03-09 NOTE — Progress Notes (Signed)
  Subjective:    Patient ID: Chelsea Hayes, female    DOB: [birth year], 31 y.o.   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 Has some break threw back pain she questions since it is about 03:00 when the pain occurs if she can have a single pill for break through pain #3 Bipolar DX apt w/ psychologist  But states family has noticed big improvement w/tegretol she did not get the Abilify due to cost  Review of Systems    BP 139/89  Pulse 98  Wt 174 lb (78.926 kg) Objective:   Physical Exam WN WD WF no acute distress Lungs clear to A&P,[facility] SI and S2       Assessment & Plan:  #1 obesity renew phentermine followup 1 month  #2 continue at current dose of methadone recommend she check her mattress and see if she needs to make a change. It turns out that she has recently changed her mattress and I recommend that she goes back to all mattress. I will also place her on Mobic 15 mg a day she can take at night to help prevent early morning back pain. Decline increasing her methadone dosage at this time. #3 bipolar disease if her psychologist or psychiatrist feels comfortable with been maintaining her on Tegretol 200 mg twice a day I will glad to do so.  Addendum it should be noted that she is no longer taking Synthroid when I looked at her labs that were ordered at the time of her physical including a TSH none was found. It turns out that she did not get her labs done and a new requisition of lab work was given to her today.

## 2011-03-09 NOTE — Patient Instructions (Signed)
Back Pain, Adult Low back pain is very common. About 1 in 5 people have back pain.The cause of low back pain is rarely dangerous. The pain often gets better over time.About half of people with a sudden onset of back pain feel better in just 2 weeks. About 8 in 10 people feel better by 6 weeks.  CAUSES Some common causes of back pain include:  Strain of the muscles or ligaments supporting the spine.   Wear and tear (degeneration) of the spinal discs.   Arthritis.   Direct injury to the back.  DIAGNOSIS Most of the time, the direct cause of low back pain is not known.However, back pain can be treated effectively even when the exact cause of the pain is unknown.Answering your caregiver's questions about your overall health and symptoms is one of the most accurate ways to make sure the cause of your pain is not dangerous. If your caregiver needs more information, he or she may order lab work or imaging tests (X-rays or MRIs).However, even if imaging tests show changes in your back, this usually does not require surgery. HOME CARE INSTRUCTIONS For many people, back pain returns.Since low back pain is rarely dangerous, it is often a condition that people can learn to manageon their own.   Remain active. It is stressful on the back to sit or stand in one place. Do not sit, drive, or stand in one place for more than 30 minutes at a time. Take short walks on level surfaces as soon as pain allows.Try to increase the length of time you walk each day.   Do not stay in bed.Resting more than 1 or 2 days can delay your recovery.   Do not avoid exercise or work.Your body is made to move.It is not dangerous to be active, even though your back may hurt.Your back will likely heal faster if you return to being active before your pain is gone.   Pay attention to your body when you bend and lift. Many people have less discomfortwhen lifting if they bend their knees, keep the load close to their  bodies,and avoid twisting. Often, the most comfortable positions are those that put less stress on your recovering back.   Find a comfortable position to sleep. Use a firm mattress and lie on your side with your knees slightly bent. If you lie on your back, put a pillow under your knees.   Only take over-the-counter or prescription medicines as directed by your caregiver. Over-the-counter medicines to reduce pain and inflammation are often the most helpful.Your caregiver may prescribe muscle relaxant drugs.These medicines help dull your pain so you can more quickly return to your normal activities and healthy exercise.   Put ice on the injured area.   Put ice in a plastic bag.   Place a towel between your skin and the bag.   Leave the ice on for 15 to 20 minutes, 3 to 4 times a day for the first 2 to 3 days. After that, ice and heat may be alternated to reduce pain and spasms.   Ask your caregiver about trying back exercises and gentle massage. This may be of some benefit.   Avoid feeling anxious or stressed.Stress increases muscle tension and can worsen back pain.It is important to recognize when you are anxious or stressed and learn ways to manage it.Exercise is a great option.  SEEK MEDICAL CARE IF:  You have pain that is not relieved with rest or medicine.   You have   pain that does not improve in 1 week.   You have new symptoms.   You are generally not feeling well.  SEEK IMMEDIATE MEDICAL CARE IF:   You have pain that radiates from your back into your legs.   You develop new bowel or bladder control problems.   You have unusual weakness or numbness in your arms or legs.   You develop nausea or vomiting.   You develop abdominal pain.   You feel faint.  Document Released: 01/23/2005 Document Revised: 10/05/2010 Document Reviewed: 06/13/2010 [employer] Patient Information 2012 [employer]. 

## 2011-03-22 ENCOUNTER — Encounter: Payer: Self-pay | Admitting: *Deleted

## 2011-03-28 ENCOUNTER — Ambulatory Visit (INDEPENDENT_AMBULATORY_CARE_PROVIDER_SITE_OTHER): Payer: Self-pay | Admitting: Family Medicine

## 2011-03-28 ENCOUNTER — Encounter: Payer: Self-pay | Admitting: Family Medicine

## 2011-03-28 DIAGNOSIS — M549 Dorsalgia, unspecified: Secondary | ICD-10-CM

## 2011-03-28 DIAGNOSIS — G8929 Other chronic pain: Secondary | ICD-10-CM

## 2011-03-28 DIAGNOSIS — L0202 Furuncle of face: Secondary | ICD-10-CM

## 2011-03-28 DIAGNOSIS — Z0000 Encounter for general adult medical examination without abnormal findings: Secondary | ICD-10-CM

## 2011-03-28 DIAGNOSIS — H6000 Abscess of external ear, unspecified ear: Secondary | ICD-10-CM

## 2011-03-28 DIAGNOSIS — E669 Obesity, unspecified: Secondary | ICD-10-CM

## 2011-03-28 MED ORDER — PHENTERMINE HCL 37.5 MG PO CAPS: 37.5000 mg | ORAL_CAPSULE | ORAL | Status: DC

## 2011-03-28 MED ORDER — CIPROFLOXACIN-DEXAMETHASONE 0.3-0.1 % OT SUSP: 4.0000 [drp] | Freq: Two times a day (BID) | OTIC | Status: DC

## 2011-03-28 MED ORDER — METHADONE HCL 10 MG PO TABS: 20.0000 mg | ORAL_TABLET | Freq: Three times a day (TID) | ORAL | Status: DC

## 2011-03-28 NOTE — Patient Instructions (Signed)

## 2011-03-28 NOTE — Progress Notes (Signed)
  Subjective:    Patient ID: Chelsea Hayes, female    DOB: Feb 19, 1980, 31 y.o.   MRN: [id number]  HPI  Obesity management patient has gained 4 pounds which is not good been taking phentermine #2 bipolar disorder  Review of Systems    left ear pain Objective:   Physical Exam  Constitutional: She appears well-developed and well-nourished.  HENT:  Head: Normocephalic.  Cardiovascular: Normal rate and regular rhythm.   Pulmonary/Chest: Effort normal and breath sounds normal. No respiratory distress.   L ear small for furuncle seen in the lower ear canal. Patient is very sensitive over that area and jumped when attempts are made to palpated with a curret.       BP 138/89  Pulse 96  Ht 5\' 4"  (1.626 m)  Wt 178 lb (80.74 kg)  BMI 30.55 kg/m2  SpO2 98% Assessment & Plan:  #1 for a couple we'll place on Cipro otic drops if resolution of the furuncle does not occur may require referral to ENT #2 bipolar disease being managed by the psychiatrist who still has her on her current medication.  #3 obesity she has gained 4 pounds she does know that we will need to keep a close eye on this we'll have her back sometime around March 31 will give her at least one more month of the phentermine but what her that we really do need to see a reversal of this weight which may be difficult since she is going to be traveling she is going to half to find some way to get exercise.

## 2011-04-04 ENCOUNTER — Ambulatory Visit: Payer: Self-pay | Admitting: Family Medicine

## 2011-04-06 ENCOUNTER — Ambulatory Visit: Payer: Self-pay | Admitting: Family Medicine

## 2011-04-18 ENCOUNTER — Other Ambulatory Visit: Payer: Self-pay | Admitting: *Deleted

## 2011-04-18 DIAGNOSIS — F419 Anxiety disorder, unspecified: Secondary | ICD-10-CM

## 2011-04-18 MED ORDER — ALPRAZOLAM 1 MG PO TABS: 1.0000 mg | ORAL_TABLET | Freq: Every day | ORAL | Status: DC | PRN

## 2011-04-20 ENCOUNTER — Telehealth: Payer: Self-pay | Admitting: *Deleted

## 2011-04-20 NOTE — Telephone Encounter (Signed)
Pt called to inform not taking the phentermine anymore because she is not loosing weight on it

## 2011-04-27 ENCOUNTER — Other Ambulatory Visit: Payer: Self-pay | Admitting: *Deleted

## 2011-04-27 DIAGNOSIS — M549 Dorsalgia, unspecified: Secondary | ICD-10-CM

## 2011-04-27 DIAGNOSIS — G8929 Other chronic pain: Secondary | ICD-10-CM

## 2011-04-27 MED ORDER — METHADONE HCL 10 MG PO TABS: 20.0000 mg | ORAL_TABLET | Freq: Three times a day (TID) | ORAL | Status: DC

## 2011-04-27 NOTE — Telephone Encounter (Signed)
Pt states she is going out of town the last week of the month. Pt is requesting for her Methadone to be filled this week. Please advise.

## 2011-04-27 NOTE — Telephone Encounter (Signed)
Fine but please post date and mark not to be filled until 05/08/2011.

## 2011-04-27 NOTE — Telephone Encounter (Signed)
Pt informed

## 2011-05-03 ENCOUNTER — Other Ambulatory Visit: Payer: Self-pay | Admitting: *Deleted

## 2011-05-03 NOTE — Telephone Encounter (Signed)
Pt has called stating that she wants to know if her methadone can be filled on the 31st instead if the 1st as you written. Please advise.

## 2011-05-04 NOTE — Telephone Encounter (Signed)
Pt.notified

## 2011-05-04 NOTE — Telephone Encounter (Signed)
One word. No.

## 2011-05-04 NOTE — Telephone Encounter (Signed)
If we use that argument then Chelsea Hayes has 28 days should I have only prescribed 140? The problem is if she is going to keep wanting her scrip filled early then irt does cause a lot of  Confusion. Either we have a day to fill w/out exception or we all will have hurdles.

## 2011-05-04 NOTE — Telephone Encounter (Signed)
One word no.

## 2011-05-04 NOTE — Telephone Encounter (Signed)
Pt states she will be without Methadone for 1 day b/c she said she started the last refill on the 1st and that March has the 31 days. I told her you said no. Please advise.

## 2011-05-29 ENCOUNTER — Other Ambulatory Visit: Payer: Self-pay | Admitting: *Deleted

## 2011-05-29 DIAGNOSIS — G8929 Other chronic pain: Secondary | ICD-10-CM

## 2011-05-29 DIAGNOSIS — M549 Dorsalgia, unspecified: Secondary | ICD-10-CM

## 2011-05-29 NOTE — Telephone Encounter (Signed)
Pt calls and request a refill on her Methadone- last fill was 05/08/2011. States they go out of town at the end of every month and didn't know how you felt about filling early adn a pharmacy filling a narcotic out of town. Please advise

## 2011-05-30 NOTE — Telephone Encounter (Signed)
What I would propose is since she's never available to have her prescription filled on the first of the month that we give her prescription for 15 days to cover her from May 1 to May 15. From then on she may get either 28 days worth, 30 days worth, 31 days worth on the 15th of each month depending the rest of the days in the calendar month. In other words on May 15 she can pick up a second prescription for 31 days of medication and on or about June 15 to be for 30 days. Next February 15 would be only for 28 days. If this is agreeable with her that she can get the prescription Thursday.

## 2011-05-31 NOTE — Telephone Encounter (Signed)
Pt notified of MD instructions and is agreeable to this. Please print Rx and pt will pick up Thursday after lunch. KJ LPN

## 2011-06-01 MED ORDER — METHADONE HCL 10 MG PO TABS: 20.0000 mg | ORAL_TABLET | Freq: Three times a day (TID) | ORAL | Status: DC

## 2011-06-15 ENCOUNTER — Ambulatory Visit: Payer: Self-pay | Admitting: Family Medicine

## 2011-06-20 ENCOUNTER — Other Ambulatory Visit: Payer: Self-pay | Admitting: *Deleted

## 2011-06-20 DIAGNOSIS — G8929 Other chronic pain: Secondary | ICD-10-CM

## 2011-06-20 DIAGNOSIS — M549 Dorsalgia, unspecified: Secondary | ICD-10-CM

## 2011-06-20 MED ORDER — METHADONE HCL 10 MG PO TABS: 20.0000 mg | ORAL_TABLET | Freq: Three times a day (TID) | ORAL | Status: DC

## 2011-07-20 ENCOUNTER — Telehealth: Payer: Self-pay | Admitting: *Deleted

## 2011-07-20 DIAGNOSIS — M549 Dorsalgia, unspecified: Secondary | ICD-10-CM

## 2011-07-20 DIAGNOSIS — F419 Anxiety disorder, unspecified: Secondary | ICD-10-CM

## 2011-07-20 MED ORDER — METHADONE HCL 10 MG PO TABS: 20.0000 mg | ORAL_TABLET | Freq: Three times a day (TID) | ORAL | Status: DC

## 2011-07-20 MED ORDER — ALPRAZOLAM 1 MG PO TABS: 1.0000 mg | ORAL_TABLET | Freq: Every day | ORAL | Status: DC | PRN

## 2011-07-20 NOTE — Telephone Encounter (Signed)
First of all since Dr. Ceasar Mons made the diagnosis and this is a completely different issue I think it would be best if she continues to see him at this time for her prescriptions. Second and this is related to above the last time we asked saw her was in February which means she should have been seen in June. I will give her a new prescription for methadone and Xanax but she must come in to be seen before any more are going to be given and we expect to see her at least every 4 months.

## 2011-07-20 NOTE — Telephone Encounter (Signed)
Pt is calling stating that Dr. Ceasar Mons has put her on Adderall 30mg  1/2 tab BID. She would like to know would you write her prescription for this or does she need to keep going to Dr. Ceasar Mons. States it is more expensive to go there. Pt is also requesting that we print out the RX for her Methadone and Xanax and let her pick them up so she can shop around for prices. Please advise.

## 2011-07-21 ENCOUNTER — Telehealth: Payer: Self-pay | Admitting: *Deleted

## 2011-07-21 DIAGNOSIS — F419 Anxiety disorder, unspecified: Secondary | ICD-10-CM

## 2011-07-21 MED ORDER — ALPRAZOLAM 1 MG PO TABS: 1.0000 mg | ORAL_TABLET | Freq: Every day | ORAL | Status: DC | PRN

## 2011-07-21 MED ORDER — KETOROLAC TROMETHAMINE 10 MG PO TABS: 10.0000 mg | ORAL_TABLET | Freq: Four times a day (QID) | ORAL | Status: AC | PRN

## 2011-07-21 NOTE — Telephone Encounter (Signed)
Dr. Thurmond Butts, you printed the rx for the Methadone and Xanax but forgot to sign them. Dr. Linford Arnold says she won't sign the one for Methadone but she can for the Xanax. Informed pt it would be Tuesday unless you came this way. I am placing the rx on your desk for you to sign. Thanks.

## 2011-07-21 NOTE — Telephone Encounter (Signed)
Pt states it was Toradol she was given before. Please advise if you will send that. She states she has hx of seizures and the Tramadol causes her to have seizures.

## 2011-07-21 NOTE — Telephone Encounter (Signed)
No other meds until Dr. Thurmond Butts is back.

## 2011-07-21 NOTE — Telephone Encounter (Signed)
rx sent

## 2011-07-21 NOTE — Telephone Encounter (Signed)
Pt informed

## 2011-07-21 NOTE — Telephone Encounter (Signed)
We filled pt's Xanax and Dr. Linford Arnold gave her Toradol to get her through until you come back on Tuesday to refill the RX for her Methadone. Please let me know if you have any questions.

## 2011-07-21 NOTE — Telephone Encounter (Signed)
Pt is asking if you are willing to give her something different for 4-5 days until Dr. Thurmond Butts gets back to sign her Methadone rx. States that if she is off of it she will get sick. Please advise.

## 2011-07-21 NOTE — Telephone Encounter (Signed)
Spoke with pt and offered tramadol per Dr. Linford Arnold but pt states she has hx of seizures and she can't take it b/c it has induced a seizure for her. Pt states she will call back with the name of the other med.

## 2011-07-21 NOTE — Telephone Encounter (Signed)
LMOM for pt to rerturn call.

## 2011-07-25 ENCOUNTER — Other Ambulatory Visit: Payer: Self-pay | Admitting: *Deleted

## 2011-07-25 DIAGNOSIS — M549 Dorsalgia, unspecified: Secondary | ICD-10-CM

## 2011-07-25 DIAGNOSIS — G8929 Other chronic pain: Secondary | ICD-10-CM

## 2011-07-25 MED ORDER — METHADONE HCL 10 MG PO TABS: 20.0000 mg | ORAL_TABLET | Freq: Three times a day (TID) | ORAL | Status: DC

## 2011-07-27 ENCOUNTER — Encounter: Payer: Self-pay | Admitting: *Deleted

## 2011-08-03 ENCOUNTER — Ambulatory Visit: Payer: Self-pay | Admitting: Family Medicine

## 2011-08-03 DIAGNOSIS — Z0289 Encounter for other administrative examinations: Secondary | ICD-10-CM

## 2011-08-23 ENCOUNTER — Telehealth: Payer: Self-pay | Admitting: *Deleted

## 2011-08-23 NOTE — Telephone Encounter (Signed)
Pt is due for a refill tomorrow and she was suppose to have already f/u with you and missed appt. Pt has rescheduled for the 30th and is asking if we can fill her Methadone for enough days until she sees you. Please advise.

## 2011-08-24 ENCOUNTER — Other Ambulatory Visit: Payer: Self-pay | Admitting: *Deleted

## 2011-08-24 MED ORDER — KETOROLAC TROMETHAMINE 10 MG PO TABS: 10.0000 mg | ORAL_TABLET | Freq: Three times a day (TID) | ORAL | Status: AC | PRN

## 2011-08-24 NOTE — Telephone Encounter (Signed)
LMOM that no refills on Methadone until seen per Dr. Thurmond Butts.

## 2011-08-24 NOTE — Telephone Encounter (Signed)
Unfortunately because the patient is on methadone and requires a high doses it was against my usual policy to extended a month. At that time I made it clear that no further refills or request will be done until she was seen. I am unable to refill further medications at this time.

## 2011-08-31 ENCOUNTER — Encounter: Payer: Self-pay | Admitting: Family Medicine

## 2011-08-31 ENCOUNTER — Ambulatory Visit (INDEPENDENT_AMBULATORY_CARE_PROVIDER_SITE_OTHER): Payer: Medicaid Other | Admitting: Family Medicine

## 2011-08-31 VITALS — BP 151/94 | HR 106 | Ht 64.0 in | Wt 145.0 lb

## 2011-08-31 DIAGNOSIS — F419 Anxiety disorder, unspecified: Secondary | ICD-10-CM

## 2011-08-31 DIAGNOSIS — G8929 Other chronic pain: Secondary | ICD-10-CM

## 2011-08-31 DIAGNOSIS — M549 Dorsalgia, unspecified: Secondary | ICD-10-CM

## 2011-08-31 DIAGNOSIS — F411 Generalized anxiety disorder: Secondary | ICD-10-CM

## 2011-08-31 MED ORDER — METHADONE HCL 10 MG PO TABS: 10.0000 mg | ORAL_TABLET | Freq: Three times a day (TID) | ORAL | Status: DC

## 2011-08-31 MED ORDER — METHADONE HCL 10 MG PO TABS: 120.0000 mg | ORAL_TABLET | Freq: Three times a day (TID) | ORAL | Status: DC

## 2011-08-31 MED ORDER — ALPRAZOLAM 1 MG PO TABS: 1.0000 mg | ORAL_TABLET | Freq: Every day | ORAL | Status: DC | PRN

## 2011-08-31 NOTE — Progress Notes (Signed)
  Subjective:    Patient ID: Chelsea Hayes, female    DOB: 1980-03-04, 31 y.o.   MRN: [id number]  HPI Patient is here for followup chronic pain. She's been on methadone 10 mg taking 2 tablets 3 times a day return of 60 mg daily. I have been concerned since she has missed the last appointment. She has informed me that she is exercising more and running. Even though she is a longer on the phentermine she has been losing weight. She has weaned herself down on the methadone and has also added a diagnosis of ADHD. She states that with the Adderall she needs less Xanax her psychiatrist no longer has a diagnosis of bipolar disease involving her.   Review of Systems  All other systems reviewed and are negative.   BP 151/94  Pulse 106  Ht 5\' 4"  (1.626 m)  Wt 145 lb (65.772 kg)  BMI 24.89 kg/m2  SpO2 98%   Objective:   Physical Exam  Constitutional: She is oriented to person, place, and time. She appears well-developed and well-nourished.  HENT:  Head: Normocephalic.  Neck: Normal range of motion.  Musculoskeletal: She exhibits tenderness.       Mild tenderness upper arm and shoulder and neck.  Neurological: She is alert and oriented to person, place, and time.  Skin: Skin is warm and dry.  Psychiatric: She has a normal mood and affect. Her behavior is normal.   Patient hasn't lost roughly 30 pounds in the last 5 months.    Assessment & Plan:  Chronic pain neck and shoulder with improvement. Since she admits she's not using as much methadone I will reduce her monthly supply from 180 to 120 or basically 4 pills a day.  Anxiety disorder. Current attacks she got in June is lasting her. Mistakenly we printed a prescription for Xanax that I voided. Return in 4 months followup.

## 2011-08-31 NOTE — Patient Instructions (Signed)
Pain Medicine Instructions  You have been given a prescription for pain medicines. These medicines may affect your ability to think clearly. They may also affect your ability to perform physical activities. Take these medicines only as needed for pain. You do not need to take them if you are not having pain, unless directed by your caregiver. You can take less than the prescribed dose if you find a smaller amount of medicine controls the pain. It may not be possible to make all of your pain go away, but you should be comfortable enough to move, breathe, and take care of yourself.  After you start taking pain medicines, while taking the medicines, and for 8 hours after stopping the medicines:   Do not drive.   Do not operate machinery.   Do not operate power tools.   Do not sign legal documents.   Do not supervise children by yourself.   Do not participate in activities that require climbing or being in high places.   Do not enter a body of water (lake, river, ocean, spa, swimming pool) without an adult nearby who can help you.  You may have been prescribed a pain medicine that contains acetaminophen (paracetamol). If so, take only the amount directed by your caregiver. Do not take any other acetaminophen while taking this medicine. An overdose of acetaminophen can result in severe liver damage. If you are taking other medicines, check the active ingredients for acetaminophen. Acetaminophen is found in hundreds of over-the-counter and prescription medicines. These include cold relief products, menstrual cramp relief medicines, fever-reducing medicines, acid indigestion relief products, and pain relief products.  HOME CARE INSTRUCTIONS    Do not drink alcohol, take sleeping pills, or take other medicines until at least 8 hours after your last dose of pain medicine, or as directed by your caregiver.   Use a bulk stool softener if you become constipated from your pain medicines. Increasing your intake of fruits  and vegetables will also help.   Write down the times when you take your medicines. Look at the times before taking your next dose of medicine. It is easy to become confused while on pain medicines. Recording the times helps you to avoid an overdose.  SEEK MEDICAL CARE IF:   Your medicine is not helping the pain go away.   You vomit or have diarrhea shortly after taking the medicine.   You develop new pain in areas that did not hurt before.  SEEK IMMEDIATE MEDICAL CARE IF:   You feel dizzy or faint.   You feel there are other problems that might be caused by your medicine.  MAKE SURE YOU:    Understand these instructions.   Will watch your condition.   Will get help right away if you are not doing well or get worse.  Document Released: 05/01/2000 Document Revised: 01/12/2011 Document Reviewed: 01/07/2010  [employer] Patient Information 2012 [employer].

## 2011-08-31 NOTE — Addendum Note (Signed)
Addended by: Hassan Rowan on: 08/31/2011 02:52 PM   Modules accepted: Orders

## 2011-09-05 ENCOUNTER — Ambulatory Visit: Payer: Self-pay | Admitting: Family Medicine

## 2011-09-28 ENCOUNTER — Other Ambulatory Visit: Payer: Self-pay | Admitting: *Deleted

## 2011-09-28 DIAGNOSIS — M549 Dorsalgia, unspecified: Secondary | ICD-10-CM

## 2011-09-28 DIAGNOSIS — G8929 Other chronic pain: Secondary | ICD-10-CM

## 2011-09-28 MED ORDER — METHADONE HCL 10 MG PO TABS: 10.0000 mg | ORAL_TABLET | Freq: Three times a day (TID) | ORAL | Status: DC

## 2011-11-02 ENCOUNTER — Other Ambulatory Visit: Payer: Self-pay | Admitting: *Deleted

## 2011-11-02 DIAGNOSIS — G8929 Other chronic pain: Secondary | ICD-10-CM

## 2011-11-02 DIAGNOSIS — M549 Dorsalgia, unspecified: Secondary | ICD-10-CM

## 2011-11-02 MED ORDER — METHADONE HCL 10 MG PO TABS: 10.0000 mg | ORAL_TABLET | Freq: Three times a day (TID) | ORAL | Status: DC

## 2011-11-10 ENCOUNTER — Other Ambulatory Visit: Payer: Self-pay | Admitting: *Deleted

## 2011-11-10 DIAGNOSIS — F419 Anxiety disorder, unspecified: Secondary | ICD-10-CM

## 2011-11-10 MED ORDER — ALPRAZOLAM 1 MG PO TABS: 1.0000 mg | ORAL_TABLET | Freq: Every day | ORAL | Status: DC | PRN

## 2011-12-05 ENCOUNTER — Other Ambulatory Visit: Payer: Self-pay | Admitting: *Deleted

## 2011-12-05 DIAGNOSIS — M549 Dorsalgia, unspecified: Secondary | ICD-10-CM

## 2011-12-05 DIAGNOSIS — G8929 Other chronic pain: Secondary | ICD-10-CM

## 2011-12-05 DIAGNOSIS — F419 Anxiety disorder, unspecified: Secondary | ICD-10-CM

## 2011-12-05 MED ORDER — METHADONE HCL 10 MG PO TABS: 10.0000 mg | ORAL_TABLET | Freq: Three times a day (TID) | ORAL | Status: DC

## 2011-12-05 MED ORDER — ALPRAZOLAM 1 MG PO TABS: 1.0000 mg | ORAL_TABLET | Freq: Every day | ORAL | Status: DC | PRN

## 2012-01-01 ENCOUNTER — Telehealth: Payer: Self-pay | Admitting: *Deleted

## 2012-01-01 NOTE — Telephone Encounter (Signed)
Request refill on Methadone. Due 29th and we are closed for holiday. Call pt when ready to pick up

## 2012-01-02 NOTE — Telephone Encounter (Signed)
You are correct- I looked and looks like Misty informed her of this. I called pt and LM on voicemail that she had to be seen before getting her refill and told to call office back and schedule appt.

## 2012-01-02 NOTE — Telephone Encounter (Signed)
Now please refresh my memory I thought she was told last month by either you or Misty she needs to be seen in November. According to my last note I told her in July she was supposed to be seen in November. Is there something that not aware of?

## 2012-01-09 ENCOUNTER — Encounter: Payer: Self-pay | Admitting: Family Medicine

## 2012-01-09 ENCOUNTER — Ambulatory Visit (INDEPENDENT_AMBULATORY_CARE_PROVIDER_SITE_OTHER): Payer: Medicaid Other | Admitting: Family Medicine

## 2012-01-09 VITALS — BP 144/59 | HR 93 | Ht 64.0 in | Wt 145.0 lb

## 2012-01-09 DIAGNOSIS — G8929 Other chronic pain: Secondary | ICD-10-CM

## 2012-01-09 DIAGNOSIS — E785 Hyperlipidemia, unspecified: Secondary | ICD-10-CM

## 2012-01-09 DIAGNOSIS — F411 Generalized anxiety disorder: Secondary | ICD-10-CM

## 2012-01-09 DIAGNOSIS — M549 Dorsalgia, unspecified: Secondary | ICD-10-CM

## 2012-01-09 DIAGNOSIS — R634 Abnormal weight loss: Secondary | ICD-10-CM

## 2012-01-09 DIAGNOSIS — F419 Anxiety disorder, unspecified: Secondary | ICD-10-CM

## 2012-01-09 LAB — CBC WITH DIFFERENTIAL/PLATELET
Basophils Absolute: 0 10*3/uL (ref 0.0–0.1)
Basophils Relative: 0 % (ref 0–1)
Eosinophils Relative: 1 % (ref 0–5)
HCT: 44.7 % (ref 36.0–46.0)
MCHC: 34.2 g/dL (ref 30.0–36.0)
MCV: 84.8 fL (ref 78.0–100.0)
Monocytes Absolute: 0.4 10*3/uL (ref 0.1–1.0)
Platelets: 279 10*3/uL (ref 150–400)
RDW: 14.4 % (ref 11.5–15.5)

## 2012-01-09 MED ORDER — ALPRAZOLAM 1 MG PO TABS: 1.0000 mg | ORAL_TABLET | Freq: Every day | ORAL | Status: DC | PRN

## 2012-01-09 MED ORDER — ESCITALOPRAM OXALATE 20 MG PO TABS: 10.0000 mg | ORAL_TABLET | Freq: Every day | ORAL | Status: DC

## 2012-01-09 MED ORDER — METHADONE HCL 10 MG PO TABS: 10.0000 mg | ORAL_TABLET | Freq: Three times a day (TID) | ORAL | Status: DC
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
## 2012-01-09 NOTE — Patient Instructions (Signed)

## 2012-01-10 ENCOUNTER — Encounter: Payer: Self-pay | Admitting: Family Medicine

## 2012-01-10 ENCOUNTER — Ambulatory Visit (INDEPENDENT_AMBULATORY_CARE_PROVIDER_SITE_OTHER): Payer: Medicaid Other | Admitting: Family Medicine

## 2012-01-10 VITALS — BP 136/89 | HR 88 | Wt 144.0 lb

## 2012-01-10 DIAGNOSIS — B852 Pediculosis, unspecified: Secondary | ICD-10-CM

## 2012-01-10 DIAGNOSIS — Z7189 Other specified counseling: Secondary | ICD-10-CM

## 2012-01-10 DIAGNOSIS — Z630 Problems in relationship with spouse or partner: Secondary | ICD-10-CM

## 2012-01-10 LAB — LIPID PANEL
LDL Cholesterol: 174 mg/dL — ABNORMAL HIGH (ref 0–99)
Total CHOL/HDL Ratio: 4.5 Ratio
VLDL: 28 mg/dL (ref 0–40)

## 2012-01-10 LAB — COMPREHENSIVE METABOLIC PANEL
ALT: 15 U/L (ref 0–35)
AST: 15 U/L (ref 0–37)
CO2: 23 mEq/L (ref 19–32)
Calcium: 9.7 mg/dL (ref 8.4–10.5)
Chloride: 106 mEq/L (ref 96–112)
Creat: 0.82 mg/dL (ref 0.50–1.10)
Sodium: 141 mEq/L (ref 135–145)
Total Bilirubin: 0.7 mg/dL (ref 0.3–1.2)
Total Protein: 7.5 g/dL (ref 6.0–8.3)

## 2012-01-10 LAB — TSH: TSH: 1.306 u[IU]/mL (ref 0.350–4.500)

## 2012-01-10 MED ORDER — MALATHION 0.5 % EX LOTN: TOPICAL_LOTION | CUTANEOUS | Status: AC

## 2012-01-10 NOTE — Patient Instructions (Addendum)
Head and Pubic Lice  Lice are tiny, light brown insects with claws on the ends of their legs. They are small parasites that live on the human body. Lice often make their home in your hair. They hatch from little round eggs (nits), which are attached to the base of hairs. They spread by:   Direct contact with an infested person.   Infested personal items such as combs, brushes, towels, clothing, pillow cases and sheets.  The parasite that causes your condition may also live in clothes which have been worn within the week before treatment. Therefore, it is necessary to wash your clothes, bed linens, towels, combs and brushes. Any woolens can be put in an air-tight plastic bag for one week. You need to use fresh clothes, towels and sheets after your treatment is completed. Re-treatment is usually not necessary if instructions are followed. If necessary, treatment may be repeated in 7 days. The entire family may require treatment. Sexual partners should be treated if the nits are present in the pubic area.  TREATMENT   Apply enough medicated shampoo or cream to wet hair and skin in and around the infected areas.   Work thoroughly into hair and leave in according to instructions.   Add a small amount of water until a good lather forms.   Rinse thoroughly.   Towel briskly.   When hair is dry, any remaining nits, cream or shampoo may be removed with a fine-tooth comb or tweezers. The nits resemble dandruff; however they are glued to the hair follicle and are difficult to brush out. Frequent fine combing and shampoos are necessary. A towel soaked in white vinegar and left on the hair for 2 hours will also help soften the glue which holds the nits on the hair.  Medicated shampoo or cream should not be used on children or pregnant women without a caregiver's prescription or instructions.  SEEK MEDICAL CARE IF:    You or your child develops sores that look infected.   The rash does not go away in one week.    The lice or nits return or persist in spite of treatment.  Document Released: 01/23/2005 Document Revised: 04/17/2011 Document Reviewed: 08/22/2006  [employer] Patient Information 2013 [employer].
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
## 2012-01-17 ENCOUNTER — Telehealth: Payer: Self-pay | Admitting: *Deleted

## 2012-01-17 NOTE — Telephone Encounter (Signed)
I'm having trouble finding any old records of an enlarged heart, there was an EKG from last year that would suggest that her heart is not enlarged which is good news.  I would advise against an ultrasound of the heart at this time.  I'd be happy to discuss other options that might be able to address her concerns if she'd like to schedule an office visit.

## 2012-01-17 NOTE — Telephone Encounter (Signed)
Informed pt and pt wish for me to forward to Dr. Thurmond Butts for his opinion

## 2012-01-17 NOTE — Telephone Encounter (Signed)
Pt calls and wants to know if you can order a ultrasound of her heart since told once that heart was enlarged and her cholesterol is still high. Please advise.  Pt wanted to know if this would be a vascular ultrasound or some other type wants to have done tomorrow

## 2012-01-18 ENCOUNTER — Ambulatory Visit: Payer: Medicaid Other | Admitting: Family Medicine

## 2012-01-18 DIAGNOSIS — Z0289 Encounter for other administrative examinations: Secondary | ICD-10-CM

## 2012-01-18 NOTE — Telephone Encounter (Signed)
Pt has appt with you today to discuss

## 2012-01-18 NOTE — Telephone Encounter (Signed)
Let me try to clarify this matter. When she was here for her last visit she mentioned about having an enlarged heart I explained to her that I have no documentation of that and if she's really worried that her heart is enlarged while the EKG was normal the echo is the only way to know for sure. I offered to schedule her for an echo then but she declined. Now she has changed her mind and wants to have echo schedule which is fine with me.

## 2012-02-12 ENCOUNTER — Encounter: Payer: Self-pay | Admitting: Family Medicine

## 2012-02-12 ENCOUNTER — Ambulatory Visit (INDEPENDENT_AMBULATORY_CARE_PROVIDER_SITE_OTHER): Payer: Medicaid Other | Admitting: Family Medicine

## 2012-02-12 VITALS — BP 137/81 | HR 82 | Wt 148.0 lb

## 2012-02-12 DIAGNOSIS — E785 Hyperlipidemia, unspecified: Secondary | ICD-10-CM

## 2012-02-12 DIAGNOSIS — Z7189 Other specified counseling: Secondary | ICD-10-CM

## 2012-02-12 DIAGNOSIS — Z8679 Personal history of other diseases of the circulatory system: Secondary | ICD-10-CM

## 2012-02-12 DIAGNOSIS — F909 Attention-deficit hyperactivity disorder, unspecified type: Secondary | ICD-10-CM

## 2012-02-12 MED ORDER — ATORVASTATIN CALCIUM 20 MG PO TABS: 20.0000 mg | ORAL_TABLET | Freq: Every day | ORAL | Status: DC

## 2012-02-12 MED ORDER — AMPHETAMINE-DEXTROAMPHETAMINE 30 MG PO TABS: ORAL_TABLET | ORAL | Status: DC
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
## 2012-03-08 ENCOUNTER — Telehealth: Payer: Self-pay | Admitting: *Deleted

## 2012-03-08 ENCOUNTER — Other Ambulatory Visit: Payer: Self-pay | Admitting: *Deleted

## 2012-03-08 MED ORDER — TRAMADOL HCL 50 MG PO TABS: 50.0000 mg | ORAL_TABLET | Freq: Three times a day (TID) | ORAL | Status: AC | PRN
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
## 2012-03-08 NOTE — Telephone Encounter (Signed)
Ultram sent in

## 2012-03-11 NOTE — Telephone Encounter (Signed)
Notified pt via VM. Instructed to call back if any questions. KG LPN

## 2012-03-19 ENCOUNTER — Ambulatory Visit: Payer: Medicaid Other | Admitting: Family Medicine

## 2012-03-19 DIAGNOSIS — Z0289 Encounter for other administrative examinations: Secondary | ICD-10-CM

## 2012-03-22 ENCOUNTER — Telehealth: Payer: Self-pay | Admitting: *Deleted

## 2012-03-22 NOTE — Telephone Encounter (Signed)
Per Dr. Ivan Anchors pt has had three no shows. We will send d/c letter per our office protocol. Letter placed in Dr. Gardner Candle box to sign

## 2012-03-22 NOTE — Telephone Encounter (Signed)
Pt called and left a message on my vm that she wants to switch from xanax to klonopin because she is having an anxiety attack and xanax is not helping

## 2012-03-22 NOTE — Telephone Encounter (Addendum)
Can you please relay to mrs. Magadan that it looks like Dr. Evelene Croon has been managing her xanax and he/she would need to be involved in the change.

## 2012-03-25 NOTE — Telephone Encounter (Signed)
At 5:06 on Friday I called and spoke with pt.   I initially advised her that she would need an appt to discuss any changes in her medication regimen. Dr. Ivan Anchors then informed me that Dr. Evelene Croon has been managing her medications. I then told pt that she would need to make an appt with Dr. Evelene Croon to discuss this. Pt states that she had already seen her that day and she does not manage her anxiety. Pt states she is a "family friend" and she just gave her some "medication to get her through." I asked pt what type of Dr. Dr. Evelene Croon is. Pt states she is a psychiatrist.  I advised pt that psychiatrist can write rx's and according to our records it looks like she has been managing this.   Pt then states she had been writing her a rx for xanax for 90 days at a time and the pt was unaware of this and she still has refills at the pharmacy that she has not picked up but she wants to change to klonopin because she is having an anxiety attack and the xanax is not helping. I told her that what she was saying to me did not make any sense. I then told her if she would wants Dr. Ivan Anchors to be involved in managing her anxiety in any way then she would need to make an appt and that we will not be changing any medications for her over the phone. I also reiterated that Dr. Evelene Croon really should be the one to manage this and this whole conversation should be with her since   she has been managing this. Pt then states to me "you dont know how these things work apparently," and that " this type of thing has happenend with Dr. Ivan Anchors before because he is such a young doctor and he is not used to how things work." She states " doesn't he know that you can't just stop taking these types of medications because your body can react to stopping them abruptly." I told the patient that I am fully aware of how matters such as the one we are discussing works and the xanax that is currenlty rx'ed is written to be taken on an as needed basis. Therefore there  shouldn't be a worry of abruptly stopping this medication. I also told her that she should be aware that any time you are calling wanting to change a medication regimen there is a strong probability that you will have to be seen and evaluated before this takes place. I also plainly stated to her that the bottom line is to schedule an appt to discuss this further and I would not continue to  argue with her and that was the final answer.Pt then hung the phone up.Call ended at 5:14pm.

## 2013-07-11 ENCOUNTER — Emergency Department: Payer: Self-pay | Admitting: Emergency Medicine

## 2013-08-15 DIAGNOSIS — Z720 Tobacco use: Secondary | ICD-10-CM | POA: Insufficient documentation

## 2013-08-15 DIAGNOSIS — M549 Dorsalgia, unspecified: Secondary | ICD-10-CM | POA: Insufficient documentation

## 2013-08-15 DIAGNOSIS — M542 Cervicalgia: Secondary | ICD-10-CM | POA: Insufficient documentation

## 2014-06-12 ENCOUNTER — Emergency Department: Payer: Medicaid Other

## 2014-06-12 ENCOUNTER — Emergency Department
Admission: EM | Admit: 2014-06-12 | Discharge: 2014-06-12 | Disposition: A | Discharge status: Home / Self Care | Payer: Medicaid Other | Attending: Emergency Medicine | Admitting: Emergency Medicine

## 2014-06-12 ENCOUNTER — Encounter: Payer: Self-pay | Admitting: Radiology

## 2014-06-12 DIAGNOSIS — F17210 Nicotine dependence, cigarettes, uncomplicated: Secondary | ICD-10-CM | POA: Diagnosis not present

## 2014-06-12 DIAGNOSIS — Y9389 Activity, other specified: Secondary | ICD-10-CM | POA: Insufficient documentation

## 2014-06-12 DIAGNOSIS — S63501A Unspecified sprain of right wrist, initial encounter: Secondary | ICD-10-CM | POA: Diagnosis not present

## 2014-06-12 DIAGNOSIS — Y998 Other external cause status: Secondary | ICD-10-CM | POA: Insufficient documentation

## 2014-06-12 DIAGNOSIS — O99331 Smoking (tobacco) complicating pregnancy, first trimester: Secondary | ICD-10-CM | POA: Insufficient documentation

## 2014-06-12 DIAGNOSIS — Z79899 Other long term (current) drug therapy: Secondary | ICD-10-CM | POA: Diagnosis not present

## 2014-06-12 DIAGNOSIS — O9A211 Injury, poisoning and certain other consequences of external causes complicating pregnancy, first trimester: Secondary | ICD-10-CM | POA: Diagnosis not present

## 2014-06-12 DIAGNOSIS — Y92410 Unspecified street and highway as the place of occurrence of the external cause: Secondary | ICD-10-CM | POA: Insufficient documentation

## 2014-06-12 DIAGNOSIS — Z3A12 12 weeks gestation of pregnancy: Secondary | ICD-10-CM | POA: Insufficient documentation
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
## 2014-06-12 NOTE — ED Notes (Signed)
Pt left without instructions

## 2014-08-12 ENCOUNTER — Encounter (HOSPITAL_COMMUNITY): Payer: Self-pay | Admitting: Emergency Medicine

## 2014-08-12 ENCOUNTER — Emergency Department (HOSPITAL_COMMUNITY)
Admission: EM | Admit: 2014-08-12 | Discharge: 2014-08-12 | Disposition: A | Discharge status: Home / Self Care | Payer: Medicaid Other | Attending: Emergency Medicine | Admitting: Emergency Medicine

## 2014-08-12 DIAGNOSIS — F419 Anxiety disorder, unspecified: Secondary | ICD-10-CM | POA: Diagnosis not present

## 2014-08-12 DIAGNOSIS — Z87828 Personal history of other (healed) physical injury and trauma: Secondary | ICD-10-CM | POA: Insufficient documentation

## 2014-08-12 DIAGNOSIS — R2231 Localized swelling, mass and lump, right upper limb: Secondary | ICD-10-CM | POA: Diagnosis present

## 2014-08-12 DIAGNOSIS — Z8639 Personal history of other endocrine, nutritional and metabolic disease: Secondary | ICD-10-CM | POA: Insufficient documentation

## 2014-08-12 DIAGNOSIS — I809 Phlebitis and thrombophlebitis of unspecified site: Secondary | ICD-10-CM | POA: Insufficient documentation

## 2014-08-12 DIAGNOSIS — L03113 Cellulitis of right upper limb: Secondary | ICD-10-CM | POA: Diagnosis not present

## 2014-08-12 DIAGNOSIS — Z720 Tobacco use: Secondary | ICD-10-CM | POA: Diagnosis not present

## 2014-08-12 LAB — BASIC METABOLIC PANEL
Anion gap: 8 (ref 5–15)
BUN: 7 mg/dL (ref 6–20)
CHLORIDE: 106 mmol/L (ref 101–111)
CO2: 25 mmol/L (ref 22–32)
CREATININE: 0.72 mg/dL (ref 0.44–1.00)
Calcium: 8.9 mg/dL (ref 8.9–10.3)
GFR calc Af Amer: >60 mL/min (ref >60)
GFR calc non Af Amer: >60 mL/min (ref >60)
Glucose, Bld: 127 mg/dL — ABNORMAL HIGH (ref 65–99)
Potassium: 3.3 mmol/L — ABNORMAL LOW (ref 3.5–5.1)
Sodium: 139 mmol/L (ref 135–145)

## 2014-08-12 LAB — CBC WITH DIFFERENTIAL/PLATELET
BASOS ABS: 0 10*3/uL (ref 0.0–0.1)
BASOS PCT: 0 % (ref 0–1)
Eosinophils Absolute: 0.4 10*3/uL (ref 0.0–0.7)
Eosinophils Relative: 5 % (ref 0–5)
HEMATOCRIT: 38 % (ref 36.0–46.0)
Hemoglobin: 13 g/dL (ref 12.0–15.0)
LYMPHS ABS: 2.7 10*3/uL (ref 0.7–4.0)
Lymphocytes Relative: 32 % (ref 12–46)
MCH: 28.4 pg (ref 26.0–34.0)
MCHC: 34.2 g/dL (ref 30.0–36.0)
MCV: 83.2 fL (ref 78.0–100.0)
MONOS PCT: 13 % — AB (ref 3–12)
Monocytes Absolute: 1.1 10*3/uL — ABNORMAL HIGH (ref 0.1–1.0)
Neutro Abs: 4.4 10*3/uL (ref 1.7–7.7)
Neutrophils Relative %: 50 % (ref 43–77)
Platelets: 185 10*3/uL (ref 150–400)
RBC: 4.57 MIL/uL (ref 3.87–5.11)
RDW: 13.6 % (ref 11.5–15.5)
WBC: 8.6 10*3/uL (ref 4.0–10.5)

## 2014-08-12 MED ORDER — VANCOMYCIN HCL IN DEXTROSE 1-5 GM/200ML-% IV SOLN
1000.0000 mg | Freq: Once | INTRAVENOUS | Status: AC
  Administered 2014-08-12: 1000 mg via INTRAVENOUS
  Filled 2014-08-12: qty 200

## 2014-08-12 MED ORDER — IBUPROFEN 600 MG PO TABS: 600.0000 mg | ORAL_TABLET | Freq: Four times a day (QID) | ORAL | Status: DC | PRN

## 2014-08-12 MED ORDER — SULFAMETHOXAZOLE-TRIMETHOPRIM 800-160 MG PO TABS: 1.0000 | ORAL_TABLET | Freq: Two times a day (BID) | ORAL | Status: AC

## 2014-08-12 MED ORDER — CEPHALEXIN 500 MG PO CAPS: 500.0000 mg | ORAL_CAPSULE | Freq: Four times a day (QID) | ORAL | Status: DC

## 2014-08-12 MED ORDER — KETOROLAC TROMETHAMINE 60 MG/2ML IM SOLN
60.0000 mg | Freq: Once | INTRAMUSCULAR | Status: AC
  Administered 2014-08-12: 60 mg via INTRAMUSCULAR
  Filled 2014-08-12: qty 2
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
## 2014-08-12 NOTE — ED Notes (Signed)
Pt reports she had a friend help her inject a crushed up pill into a vein into both arms, about a few days apart. Red and hard area to right upper arm and left arm has red spots and a bruise.

## 2014-08-12 NOTE — ED Notes (Signed)
Pt. reports right upper arm swelling with pain onset yesterday , pt. stated she has been using the same spot for Heroin injection , denies fever / no drainage .

## 2014-08-12 NOTE — Discharge Instructions (Signed)
You were seen today and found to have inflammation of your veins and infection of her skin likely because of drug use. He will be given antibiotics. If he has new or worsening pain, increasing redness he should be reevaluated immediately. You should return tomorrow for formal ultrasound to evaluate for deep clot.   Phlebitis Phlebitis is soreness and swelling (inflammation) of a vein. This can occur in your arms, legs, or torso (trunk), as well as deeper inside your body. Phlebitis is usually not serious when it occurs close to the surface of the body. However, it can cause serious problems when it occurs in a vein deeper inside the body. CAUSES  Phlebitis can be triggered by various things, including:   Reduced blood flow through your veins. This can happen with:  Bed rest over a long period.  Long-distance travel.  Injury.  Surgery.  Being overweight (obese) or pregnant.  Having an IV tube put in the vein and getting certain medicines through the vein.  Cancer and cancer treatment.  Use of illegal drugs taken through the vein.  Inflammatory diseases.  Inherited (genetic) diseases that increase the risk of blood clots.  Hormone therapy, such as birth control pills. SIGNS AND SYMPTOMS   Red, tender, swollen, and painful area on your skin. Usually, the area will be long and narrow.  Firmness along the center of the affected area. This can indicate that a blood clot has formed.  Low-grade fever. DIAGNOSIS  A health care provider can usually diagnose phlebitis by examining the affected area and asking about your symptoms. To check for infection or blood clots, your health care provider may order blood tests or an ultrasound exam of the area. Blood tests and your family history may also indicate if you have an underlying genetic disease that causes blood clots. Occasionally, a piece of tissue is taken from the body (biopsy sample) if an unusual cause of phlebitis is  suspected. TREATMENT  Treatment will vary depending on the severity of the condition and the area of the body affected. Treatment may include:  Use of a warm compress or heating pad.  Use of compression stockings or bandages.  Anti-inflammatory medicines.  Removal of any IV tube that may be causing the problem.  Medicines that kill germs (antibiotics) if an infection is present.  Blood-thinning medicines if a blood clot is suspected or present.  In rare cases, surgery may be needed to remove damaged sections of vein. HOME CARE INSTRUCTIONS   Only take over-the-counter or prescription medicines as directed by your health care provider. Take all medicines exactly as prescribed.  Raise (elevate) the affected area above the level of your heart as directed by your health care provider.  Apply a warm compress or heating pad to the affected area as directed by your health care provider. Do not sleep with the heating pad.  Use compression stockings or bandages as directed. These will speed healing and prevent the condition from coming back.  If you are on blood thinners:  Get follow-up blood tests as directed by your health care provider.  Check with your health care provider before using any new medicines.  Carry a medical alert card or wear your medical alert jewelry to show that you are on blood thinners.  For phlebitis in the legs:  Avoid prolonged standing or bed rest.  Keep your legs moving. Raise your legs when sitting or lying.  Do not smoke.  Women, particularly those over the age of 102,  should consider the risks and benefits of taking the contraceptive pill. This kind of hormone treatment can increase your risk for blood clots.  Follow up with your health care provider as directed. SEEK MEDICAL CARE IF:   You have unusual bruising or any bleeding problems.  Your swelling or pain in the affected area is not improving.  You are on anti-inflammatory medicine, and  you develop belly (abdominal) pain. SEEK IMMEDIATE MEDICAL CARE IF:   You have a sudden onset of chest pain or difficulty breathing.  You have a fever or persistent symptoms for more than 2-3 days.  You have a fever and your symptoms suddenly get worse. MAKE SURE YOU:  Understand these instructions.  Will watch your condition.  Will get help right away if you are not doing well or get worse. Document Released: 01/17/2001 Document Revised: 11/13/2012 Document Reviewed: 09/30/2012 [facility] Patient Information 2015 [employer] [place]. This information is not intended to replace advice given to you by your health care provider. Make sure you discuss any questions you have with your health care provider. Cellulitis Cellulitis is an infection of the skin and the tissue beneath it. The infected area is usually red and tender. Cellulitis occurs most often in the arms and lower legs.  CAUSES  Cellulitis is caused by bacteria that enter the skin through cracks or cuts in the skin. The most common types of bacteria that cause cellulitis are staphylococci and streptococci. SIGNS AND SYMPTOMS   Redness and warmth.  Swelling.  Tenderness or pain.  Fever. DIAGNOSIS  Your health care provider can usually determine what is wrong based on a physical exam. Blood tests may also be done. TREATMENT  Treatment usually involves taking an antibiotic medicine. HOME CARE INSTRUCTIONS   Take your antibiotic medicine as directed by your health care provider. Finish the antibiotic even if you start to feel better.  Keep the infected arm or leg elevated to reduce swelling.  Apply a warm cloth to the affected area up to 4 times per day to relieve pain.  Take medicines only as directed by your health care provider.  Keep all follow-up visits as directed by your health care provider. SEEK MEDICAL CARE IF:   You notice red streaks coming from the infected area.  Your red area gets larger or turns dark in  color.  Your bone or joint underneath the infected area becomes painful after the skin has healed.  Your infection returns in the same area or another area.  You notice a swollen bump in the infected area.  You develop new symptoms.  You have a fever. SEEK IMMEDIATE MEDICAL CARE IF:   You feel very sleepy.  You develop vomiting or diarrhea.  You have a general ill feeling (malaise) with muscle aches and pains. MAKE SURE YOU:   Understand these instructions.  Will watch your condition.  Will get help right away if you are not doing well or get worse. Document Released: 11/02/2004 Document Revised: 06/09/2013 Document Reviewed: 04/10/2011 [facility] Patient Information 2015 [employer], [place]. This information is not intended to replace advice given to you by your health care provider. Make sure you discuss any questions you have with your health care provider.

## 2014-08-17 ENCOUNTER — Encounter (HOSPITAL_COMMUNITY): Payer: Self-pay | Admitting: Emergency Medicine

## 2014-08-17 ENCOUNTER — Emergency Department (HOSPITAL_COMMUNITY)
Admission: EM | Admit: 2014-08-17 | Discharge: 2014-08-17 | Disposition: A | Discharge status: Home / Self Care | Payer: Medicaid Other | Attending: Emergency Medicine | Admitting: Emergency Medicine

## 2014-08-17 ENCOUNTER — Emergency Department (HOSPITAL_BASED_OUTPATIENT_CLINIC_OR_DEPARTMENT_OTHER)
Admit: 2014-08-17 | Discharge: 2014-08-17 | Disposition: A | Discharge status: Home / Self Care | Payer: Medicaid Other | Attending: Emergency Medicine | Admitting: Emergency Medicine

## 2014-08-17 DIAGNOSIS — Z792 Long term (current) use of antibiotics: Secondary | ICD-10-CM | POA: Insufficient documentation

## 2014-08-17 DIAGNOSIS — I82611 Acute embolism and thrombosis of superficial veins of right upper extremity: Secondary | ICD-10-CM | POA: Diagnosis not present

## 2014-08-17 DIAGNOSIS — Z8639 Personal history of other endocrine, nutritional and metabolic disease: Secondary | ICD-10-CM | POA: Insufficient documentation

## 2014-08-17 DIAGNOSIS — M79601 Pain in right arm: Secondary | ICD-10-CM | POA: Diagnosis present

## 2014-08-17 DIAGNOSIS — M79609 Pain in unspecified limb: Secondary | ICD-10-CM

## 2014-08-17 DIAGNOSIS — M7981 Nontraumatic hematoma of soft tissue: Secondary | ICD-10-CM | POA: Diagnosis not present

## 2014-08-17 DIAGNOSIS — Z87828 Personal history of other (healed) physical injury and trauma: Secondary | ICD-10-CM | POA: Diagnosis not present

## 2014-08-17 DIAGNOSIS — T148XXA Other injury of unspecified body region, initial encounter: Secondary | ICD-10-CM

## 2014-08-17 DIAGNOSIS — I808 Phlebitis and thrombophlebitis of other sites: Secondary | ICD-10-CM | POA: Insufficient documentation

## 2014-08-17 DIAGNOSIS — Z720 Tobacco use: Secondary | ICD-10-CM | POA: Insufficient documentation

## 2014-08-17 DIAGNOSIS — I809 Phlebitis and thrombophlebitis of unspecified site: Secondary | ICD-10-CM

## 2014-08-17 LAB — CBC WITH DIFFERENTIAL/PLATELET
Basophils Absolute: 0.1 10*3/uL (ref 0.0–0.1)
Basophils Relative: 1 % (ref 0–1)
EOS PCT: 9 % — AB (ref 0–5)
Eosinophils Absolute: 0.6 10*3/uL (ref 0.0–0.7)
HCT: 38.8 % (ref 36.0–46.0)
Hemoglobin: 13.1 g/dL (ref 12.0–15.0)
LYMPHS ABS: 2.6 10*3/uL (ref 0.7–4.0)
LYMPHS PCT: 38 % (ref 12–46)
MCH: 27.8 pg (ref 26.0–34.0)
MCHC: 33.8 g/dL (ref 30.0–36.0)
MCV: 82.2 fL (ref 78.0–100.0)
Monocytes Absolute: 0.6 10*3/uL (ref 0.1–1.0)
Monocytes Relative: 9 % (ref 3–12)
Neutro Abs: 3 10*3/uL (ref 1.7–7.7)
Neutrophils Relative %: 43 % (ref 43–77)
PLATELETS: 172 10*3/uL (ref 150–400)
RBC: 4.72 MIL/uL (ref 3.87–5.11)
RDW: 13.5 % (ref 11.5–15.5)
WBC: 6.9 10*3/uL (ref 4.0–10.5)

## 2014-08-17 LAB — BASIC METABOLIC PANEL
Anion gap: 8 (ref 5–15)
BUN: 23 mg/dL — AB (ref 6–20)
CO2: 24 mmol/L (ref 22–32)
CREATININE: 0.81 mg/dL (ref 0.44–1.00)
Calcium: 9.3 mg/dL (ref 8.9–10.3)
Chloride: 107 mmol/L (ref 101–111)
Glucose, Bld: 96 mg/dL (ref 65–99)
Potassium: 3.5 mmol/L (ref 3.5–5.1)
Sodium: 139 mmol/L (ref 135–145)

## 2014-08-17 LAB — PROTIME-INR
INR: 1.04 (ref 0.00–1.49)
Prothrombin Time: 13.8 seconds (ref 11.6–15.2)

## 2014-08-17 NOTE — Progress Notes (Signed)
Right upper extremity venous duplex completed:  No evidence of DVT.  There appears to be superficial thrombosis in the right cephalic vein.

## 2014-08-17 NOTE — ED Notes (Signed)
Pt states she was seen here for celluitis and told to follow up the next day for vascular to check for blood clots. Pt did not follow up and needs an ultrasound. Pt has bruising all along right arm.

## 2014-08-17 NOTE — ED Notes (Signed)
PA at bedside.

## 2014-08-17 NOTE — Discharge Instructions (Signed)
Continue taking ibuprofen as needed for pain and to help with the knot in your arm. Use warm compresses to the area to help with pain and swelling. Continue your antibiotic as directed previously. Do not inject anything else into your arm. Follow up with your regular doctor in 3-5 days for recheck of ongoing symptoms. Return to the ER for changes or worsening symptoms.   Phlebitis Phlebitis is soreness and puffiness (swelling) in a vein.  HOME CARE  Only take medicine as told by your doctor.  Raise (elevate) the affected limb on a pillow as told by your doctor.  Keep a warm pack on the affected vein as told by your doctor. Do not sleep with a heating pad.  Use special stockings or bandages around the area of the affected vein as told by your doctor. These will speed healing and keep the condition from coming back.  Talk to your doctor about all the medicines you take.  Get follow-up blood tests as told by your doctor.  If the phlebitis is in your legs:  Avoid standing or resting for long periods.  Keep your legs moving. Raise your legs when you sit or lie.  Do not smoke.  Follow-up with your doctor as told. GET HELP IF:  You have strange bruises or bleeding.  Your puffiness or pain in the affected area is not getting better.  You are taking medicine to lessen puffiness (anti-inflammatory medicine), and you get belly pain.  You have a fever. GET HELP RIGHT AWAY IF:   The phlebitis gets worse and you have more pain, puffiness (swelling), or redness.  You have trouble breathing or have chest pain. MAKE SURE YOU:   Understand these instructions.  Will watch your condition.  Will get help right away if you are not doing well or get worse. Document Released: 01/11/2009 Document Revised: 01/28/2013 Document Reviewed: 09/30/2012 [facility] Patient Information 2015 [place], [place]. This information is not intended to replace advice given to you by your health care provider.  Make sure you discuss any questions you have with your health care provider.  Hematoma A hematoma is a collection of blood. The collection of blood can turn into a hard, painful lump under the skin. Your skin may turn blue or yellow if the hematoma is close to the surface of the skin. Most hematomas get better in a few days to weeks. Some hematomas are serious and need medical care. Hematomas can be very small or very big. HOME CARE  Apply ice to the injured area:  Put ice in a plastic bag.  Place a towel between your skin and the bag.  Leave the ice on for 20 minutes, 2-3 times a day for the first 1 to 2 days.  After the first 2 days, switch to using warm packs on the injured area.  Raise (elevate) the injured area to lessen pain and puffiness (swelling). You may also wrap the area with an elastic bandage. Make sure the bandage is not wrapped too tight.  If you have a painful hematoma on your leg or foot, you may use crutches for a couple days.  Only take medicines as told by your doctor. GET HELP RIGHT AWAY IF:   Your pain gets worse.  Your pain is not controlled with medicine.  You have a fever.  Your puffiness gets worse.  Your skin turns more blue or yellow.  Your skin over the hematoma breaks or starts bleeding.  Your hematoma is in your chest  or belly (abdomen) and you are short of breath, feel weak, or have a change in consciousness.  Your hematoma is on your scalp and you have a headache that gets worse or a change in alertness or consciousness. MAKE SURE YOU:   Understand these instructions.  Will watch your condition.  Will get help right away if you are not doing well or get worse. Document Released: 03/02/2004 Document Revised: 09/25/2012 Document Reviewed: 07/03/2012 [facility] Patient Information 2015 [place]. This information is not intended to replace advice given to you by your health care provider. Make sure you discuss any questions you have  with your health care provider.  Heat Therapy Heat therapy can help make painful, stiff muscles and joints feel better. Do not use heat on new injuries. Wait at least 48 hours after an injury to use heat. Do not use heat when you have aches or pains right after an activity. If you still have pain 3 hours after stopping the activity, then you may use heat. HOME CARE Wet heat pack  Soak a clean towel in warm water. Squeeze out the extra water.  Put the warm, wet towel in a plastic bag.  Place a thin, dry towel between your skin and the bag.  Put the heat pack on the area for 5 minutes, and check your skin. Your skin may be pink, but it should not be red.  Leave the heat pack on the area for 15 to 30 minutes.  Repeat this every 2 to 4 hours while awake. Do not use heat while you are sleeping. Warm water bath  Fill a tub with warm water.  Place the affected body part in the tub.  Soak the area for 20 to 40 minutes.  Repeat as needed. Hot water bottle  Fill the water bottle half full with hot water.  Press out the extra air. Close the cap tightly.  Place a dry towel between your skin and the bottle.  Put the bottle on the area for 5 minutes, and check your skin. Your skin may be pink, but it should not be red.  Leave the bottle on the area for 15 to 30 minutes.  Repeat this every 2 to 4 hours while awake. Electric heating pad  Place a dry towel between your skin and the heating pad.  Set the heating pad on low heat.  Put the heating pad on the area for 10 minutes, and check your skin. Your skin may be pink, but it should not be red.  Leave the heating pad on the area for 20 to 40 minutes.  Repeat this every 2 to 4 hours while awake.  Do not lie on the heating pad.  Do not fall asleep while using the heating pad.  Do not use the heating pad near water. GET HELP RIGHT AWAY IF:  You get blisters or red skin.  Your skin is puffy (swollen), or you lose feeling  (numbness) in the affected area.  You have any new problems.  Your problems are getting worse.  You have any questions or concerns. If you have any problems, stop using heat therapy until you see your doctor. MAKE SURE YOU:  Understand these instructions.  Will watch your condition.  Will get help right away if you are not doing well or get worse. Document Released: 04/17/2011 Document Reviewed: 03/18/2013 [facility] Patient Information 2015 [place], [place]. This information is not intended to replace advice given to you by your health care provider.  Make sure you discuss any questions you have with your health care provider. ° °
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
## 2014-08-17 NOTE — ED Notes (Signed)
Pt transported to vascular.  °

## 2015-02-01 ENCOUNTER — Encounter (HOSPITAL_COMMUNITY): Payer: Self-pay | Admitting: Emergency Medicine

## 2015-02-01 ENCOUNTER — Emergency Department (HOSPITAL_COMMUNITY): Payer: Medicaid Other

## 2015-02-01 DIAGNOSIS — R109 Unspecified abdominal pain: Secondary | ICD-10-CM | POA: Insufficient documentation

## 2015-02-01 DIAGNOSIS — F1123 Opioid dependence with withdrawal: Secondary | ICD-10-CM | POA: Diagnosis not present

## 2015-02-01 DIAGNOSIS — R079 Chest pain, unspecified: Secondary | ICD-10-CM | POA: Diagnosis present

## 2015-02-01 DIAGNOSIS — F17210 Nicotine dependence, cigarettes, uncomplicated: Secondary | ICD-10-CM | POA: Diagnosis not present

## 2015-02-01 LAB — BASIC METABOLIC PANEL
ANION GAP: 14 (ref 5–15)
BUN: 12 mg/dL (ref 6–20)
CALCIUM: 9.7 mg/dL (ref 8.9–10.3)
CO2: 21 mmol/L — ABNORMAL LOW (ref 22–32)
CREATININE: 0.81 mg/dL (ref 0.44–1.00)
Chloride: 104 mmol/L (ref 101–111)
Glucose, Bld: 156 mg/dL — ABNORMAL HIGH (ref 65–99)
Potassium: 3.6 mmol/L (ref 3.5–5.1)
SODIUM: 139 mmol/L (ref 135–145)

## 2015-02-01 LAB — CBC
HEMATOCRIT: 44.5 % (ref 36.0–46.0)
Hemoglobin: 15.2 g/dL — ABNORMAL HIGH (ref 12.0–15.0)
MCH: 28 pg (ref 26.0–34.0)
MCHC: 34.2 g/dL (ref 30.0–36.0)
MCV: 82 fL (ref 78.0–100.0)
PLATELETS: 383 10*3/uL (ref 150–400)
RBC: 5.43 MIL/uL — ABNORMAL HIGH (ref 3.87–5.11)
RDW: 13.1 % (ref 11.5–15.5)
WBC: 10.9 10*3/uL — ABNORMAL HIGH (ref 4.0–10.5)

## 2015-02-01 LAB — I-STAT TROPONIN, ED: TROPONIN I, POC: 0 ng/mL (ref 0.00–0.08)

## 2015-02-01 NOTE — ED Notes (Signed)
Patient here with complaint of left chest tightness and feeling "like my blood pressure is really high" possibly secondary to stopping use of heroine 2.5 days ago. Reports a 2 year history of heroine use.

## 2015-02-02 ENCOUNTER — Encounter (HOSPITAL_COMMUNITY): Payer: Self-pay | Admitting: Emergency Medicine

## 2015-02-02 ENCOUNTER — Emergency Department (HOSPITAL_COMMUNITY)
Admission: EM | Admit: 2015-02-02 | Discharge: 2015-02-02 | Disposition: A | Discharge status: Home / Self Care | Payer: Medicaid Other | Attending: Emergency Medicine | Admitting: Emergency Medicine

## 2015-02-02 DIAGNOSIS — F1123 Opioid dependence with withdrawal: Secondary | ICD-10-CM

## 2015-02-02 DIAGNOSIS — F1193 Opioid use, unspecified with withdrawal: Secondary | ICD-10-CM

## 2015-02-02 MED ORDER — CLONIDINE HCL 0.2 MG PO TABS
0.2000 mg | ORAL_TABLET | Freq: Once | ORAL | Status: AC
  Administered 2015-02-02: 0.2 mg via ORAL
  Filled 2015-02-02: qty 1

## 2015-02-02 MED ORDER — ZOLPIDEM TARTRATE 5 MG PO TABS: 5.0000 mg | ORAL_TABLET | Freq: Every evening | ORAL | Status: DC | PRN

## 2015-02-02 MED ORDER — PROMETHAZINE HCL 25 MG RE SUPP: 25.0000 mg | Freq: Four times a day (QID) | RECTAL | Status: DC | PRN

## 2015-02-02 MED ORDER — ONDANSETRON 4 MG PO TBDP
8.0000 mg | ORAL_TABLET | Freq: Once | ORAL | Status: AC
  Administered 2015-02-02: 8 mg via ORAL
  Filled 2015-02-02: qty 2

## 2015-02-02 MED ORDER — CLONIDINE HCL 0.1 MG PO TABS: 0.1000 mg | ORAL_TABLET | Freq: Four times a day (QID) | ORAL | Status: DC | PRN

## 2015-02-02 MED ORDER — ONDANSETRON 8 MG PO TBDP: 8.0000 mg | ORAL_TABLET | Freq: Three times a day (TID) | ORAL | Status: DC | PRN

## 2015-02-02 MED ORDER — LORAZEPAM 1 MG PO TABS
1.0000 mg | ORAL_TABLET | Freq: Once | ORAL | Status: AC
  Administered 2015-02-02: 1 mg via ORAL
  Filled 2015-02-02 (×2): qty 1

## 2015-02-02 MED ORDER — NAPROXEN 500 MG PO TABS: 500.0000 mg | ORAL_TABLET | Freq: Two times a day (BID) | ORAL | Status: DC

## 2015-02-02 NOTE — ED Provider Notes (Signed)
CSN: [id number]     Arrival date & time 02/01/15  2020 History   By signing my name below, I, Chelsea Hayes, attest that this documentation has been prepared under the direction and in the presence of Azalia BilisKevin Daianna Vasques, MD.  Electronically Signed: Arlan OrganAshley Hayes, ED Scribe. 02/02/2015. 12:49 AM.   Chief Complaint  Patient presents with  . Chest Pain  . Addiction Problem   The history is provided by the patient. No language interpreter was used.    HPI Comments: Chelsea Hayes is a 34 y.o. female without any pertinent past medical history who presents to the Emergency Department complaining of constant, ongoing L sided chest pain x 2 days. Pain is described as tightness. No aggravating or alleviating factors at this time. She also reports nausea, abdominal discomfort, and chills. No OTC medications or home remedies attempted prior to arrival without any improvement. No recent fever, shortness of breath, vomiting or diarrhea. [name] admits she is currently attempting to detox from Heroin and has not used in the last 2 days. 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  PCP: No primary care provider on file.    History reviewed. No pertinent past medical history. History reviewed. No pertinent past surgical history. History reviewed. No pertinent family history. Social History  Substance Use Topics  . Smoking status: Current Some Day Smoker    Types: Cigarettes  . Smokeless tobacco: None  . Alcohol Use: No   OB History    No data available     Review of Systems  A complete 10 system review of systems was obtained and all systems are negative except as noted in the HPI and PMH.    Allergies  Review of patient's allergies indicates no known allergies.  Home Medications   Prior to Admission medications   Not on File   Triage Vitals: BP 131/85 mmHg  Pulse 65  Temp(Src) 97.5 F (36.4 C) (Oral)  Resp 20  Ht 5\' 3"  (1.6 m)  Wt 142 lb (64.411 kg)  BMI 25.16 kg/m2   SpO2 99%  LMP    Physical Exam  Constitutional: She is oriented to person, place, and time. She appears well-developed and well-nourished. No distress.  HENT:  Head: Normocephalic and atraumatic.  Eyes: EOM are normal.  Neck: Normal range of motion.  Cardiovascular: Normal rate, regular rhythm and normal heart sounds.   Pulmonary/Chest: Effort normal and breath sounds normal.  Abdominal: Soft. She exhibits no distension. There is no tenderness.  Musculoskeletal: Normal range of motion.  Neurological: She is alert and oriented to person, place, and time.  Skin: Skin is warm and dry.  Psychiatric: She has a normal mood and affect. Judgment normal.  Nursing note and vitals reviewed.   ED Course  Procedures (including critical care time)  DIAGNOSTIC STUDIES: Oxygen Saturation is 99% on RA, Normal by my interpretation.    COORDINATION OF CARE: 12:44 AM- Will give Catapres, Ativan, and Zofran. Will order CXR, BMP, i-stat troponin, CBC, or EKG. Discussed treatment plan with pt at bedside and pt agreed to plan.     Labs Review Labs Reviewed  BASIC METABOLIC PANEL - Abnormal; Notable for the following:    CO2 21 (*)    Glucose, Bld 156 (*)    All other components within normal limits  CBC - Abnormal; Notable for the following:    WBC 10.9 (*)    RBC 5.43 (*)    Hemoglobin 15.2 (*)    All other  components within normal limits  I-STAT TROPOININ, ED    Imaging Review Dg Chest 2 View  02/01/2015  CLINICAL DATA:  Chest tightness and shortness of breath. EXAM: CHEST  2 VIEW COMPARISON:  None. FINDINGS: The heart size and mediastinal contours are within normal limits. Both lungs are clear. The visualized skeletal structures are unremarkable. IMPRESSION: No active cardiopulmonary disease. Electronically Signed   By: Signa Kell M.D.   On: 02/01/2015 21:08   I have personally reviewed and evaluated these images and lab results as part of my medical decision-making.   EKG  Interpretation   Date/Time:  Monday February 01 2015 20:26:00 EST Ventricular Rate:  82 PR Interval:  166 QRS Duration: 86 QT Interval:  396 QTC Calculation: 462 R Axis:   102 Text Interpretation:  Normal sinus rhythm Rightward axis Borderline ECG No  old tracing to compare Confirmed by Shaquita Fort  MD, Caryn Bee ([id number]) on  02/02/2015 12:46:05 AM      MDM   Final diagnoses:  None    The patient presents today with narcotic abuse.  There is no indication for involuntary commitment for inpatient treatment.  I think the patient is best managed as an outpatient for his/her opioid abuse.  The patient will be discharged home with a prescription for clonidine, Ambien, and antiemitics.    I personally performed the services described in this documentation, which was scribed in my presence. The recorded information has been reviewed and is accurate.       Azalia Bilis, MD 02/02/15 [phone]

## 2015-02-02 NOTE — Discharge Instructions (Signed)
Opioid Withdrawal °Opioids are a group of narcotic drugs. They include the street drug heroin. They also include pain medicines, such as morphine, hydrocodone, oxycodone, and fentanyl. Opioid withdrawal is a group of characteristic physical and mental signs and symptoms. It typically occurs if you have been using opioids daily for several weeks or longer and stop using or rapidly decrease use. Opioid withdrawal can also occur if you have used opioids daily for a long time and are given a medicine to block the effect.  °SIGNS AND SYMPTOMS °Opioid withdrawal includes three or more of the following symptoms:  °· Depressed, anxious, or irritable mood. °· Nausea or vomiting. °· Muscle aches or spasms.   °· Watery eyes.    °· Runny nose. °· Dilated pupils, sweating, or hairs standing on end. °· Diarrhea or intestinal cramping. °· Yawning.   °· Fever. °· Increased blood pressure. °· Fast pulse. °· Restlessness or trouble sleeping. °These signs and symptoms occur within several hours of stopping or reducing short-acting opioids, such as heroin. They can occur within 3 days of stopping or reducing long-acting opioids, such as methadone. Withdrawal begins within minutes of receiving a drug that blocks the effects of opioids, such as naltrexone or naloxone. °DIAGNOSIS  °Opioid use disorder is diagnosed by your health care provider. You will be asked about your symptoms, drug and alcohol use, medical history, and use of medicines. A physical exam may be done. Lab tests may be ordered. Your health care provider may have you see a mental health professional.  °TREATMENT  °The treatment for opioid withdrawal is usually provided by medical doctors with special training in substance use disorders (addiction specialists). The following medicines may be included in treatment: °· Opioids given in place of the abused opioid. They turn on opioid receptors in the brain and lessen or prevent withdrawal symptoms. They are gradually  decreased (opioid substitution and taper). °· Non-opioids that can lessen certain opioid withdrawal symptoms. They may be used alone or with opioid substitution and taper. °Successful long-term recovery usually requires medicine, counseling, and group support. °HOME CARE INSTRUCTIONS  °· Take medicines only as directed by your health care provider. °· Check with your health care provider before starting new medicines. °· Keep all follow-up visits as directed by your health care provider. °SEEK MEDICAL CARE IF: °· You are not able to take your medicines as directed. °· Your symptoms get worse. °· You relapse. °SEEK IMMEDIATE MEDICAL CARE IF: °· You have serious thoughts about hurting yourself or others. °· You have a seizure. °· You lose consciousness. °  °This information is not intended to replace advice given to you by your health care provider. Make sure you discuss any questions you have with your health care provider. °  °Document Released: 01/26/2003 Document Revised: 02/13/2014 Document Reviewed: 02/05/2013 °Elsevier Interactive Patient Education ©2016 [employer]vier Inc. ° ° °Emergency Department Resource Guide °1) Find a Doctor and Pay Out of Pocket °Although you won't have to find out who is covered by your insurance plan, it is a good idea to ask around and get recommendations. You will then need to call the office and see if the doctor you have chosen will accept you as a new patient and what types of options they offer for patients who are self-pay. Some doctors offer discounts or will set up payment plans for their patients who do not have insurance, but you will need to ask so you aren't surprised when you get to your appointment. ° °2) Contact Your Local Health   Department °Not all health departments have doctors that can see patients for sick visits, but many do, so it is worth a call to see if yours does. If you don't know where your local health department is, you can check in your phone book. The CDC  also has a tool to help you locate your state's health department, and many state websites also have listings of all of their local health departments. ° °3) Find a Walk-in Clinic °If your illness is not likely to be very severe or complicated, you may want to try a walk in clinic. These are popping up all over the country in pharmacies, drugstores, and shopping centers. They're usually staffed by nurse practitioners or physician assistants that have been trained to treat common illnesses and complaints. They're usually fairly quick and inexpensive. However, if you have serious medical issues or chronic medical problems, these are probably not your best option. ° °No Primary Care Doctor: °- Call [facility] at  832-8000 - they can help you locate a primary care doctor that  accepts your insurance, provides certain services, etc. °- Physician Referral Service- 1-800-533-3463 ° °Chronic Pain Problems: °Organization         Address  Phone   Notes  °[facility]  (336) 297-2271 Patients need to be referred by their primary care doctor.  ° °Medication Assistance: °Organization         Address  Phone   Notes  °[place] [facility] [address] °[place], [place] [address] (336) 641-8030 --Must be a resident of [place] °-- Must have NO insurance coverage whatsoever (no Medicaid/ Medicare, etc.) °-- The pt. MUST have a primary care doctor that directs their care regularly and follows them in the community °  °[facility]  (866) 331-1348   °[employer]  (888) 892-1162   ° °Agencies that provide inexpensive medical care: °Organization         Address  Phone   Notes  °[facility]  (336) 832-8035   °[facility]    (336) 832-7272   °[facility] [address] °[place], [place] [address] (336) 832-4777   °[facility] [address], °[place] (336) 271-4999   °[facility]    (336)  373-0678   °[facility]    (336) 272-1050   °[facility] ° [address], [place] Phone:  (336) 832-4444, Fax:  (336) 832-4440 Hours of Operation:  9 am - 6 pm, M-F.  Also accepts Medicaid/Medicare and self-pay.  °[facility] ° [address], [place] Phone: (336) 832-3150, Fax: (336) [phone]. Hours of Operation:  8:30 am - 5:30 pm, M-F.  Also accepts Medicaid and self-pay.  °[facility] [place] [address], [place] Phone: (336) 878-6027   °[facility] [address], [place], [place] (336)723-1848, Ext. 123 Mondays & Thursdays: 7-9 AM.  First 15 patients are seen on a first come, first serve basis. °  ° °Medicaid-accepting [place] Providers: ° °Organization         Address  Phone   Notes  °[facility] [address], [place] (336) 641-2100 Also accepts self-pay patients.  °[facility] [address], [place] ° (336) 856-9996   °[facility] [address],  (336) 288-8857   °  [facility] [address] [place] [address], [place] (336) 299-7000   °Veita Bland [address], [place]  ° (336) 373-1557 Only accepts [place] Access Medicaid patients after they have their name applied to their card.  ° °Self-Pay (no insurance) in [place]: ° °Organization         Address  Phone   Notes  °Sickle Cell Patients, [facility] [address], [place] (336) 832-1970   °[facility] [address], [place] (336) 832-4400   °[facility] ° [address], [place] (336) 992-4800   °[facility]/Dr. Osei-Bonsu ° [address] [place] [address], [place] or [address], [place] (336) [phone] Phone number for both [place] and [place] locations is the same.  °[facility] [address], [place] (336) 299-0000   °[facility] [address] [place] [address], [place] or [address] (336) 852-7530 °(336) 878-2260   °[facility] [address], [place] (336) 350-1642, phone; (336) 294-5005, fax Sees patients 1st and 3rd Saturday of every month.  Must not qualify for public or private insurance (i.e. Medicaid, Medicare, [place] Health Choice, Veterans' Benefits) • Household income should be no more than 200% of the poverty level •The clinic cannot treat you if you are pregnant or think you are pregnant • Sexually transmitted diseases are not treated at the clinic.  ° ° °Dental Care: °Organization         Address  Phone  Notes  °[facility] [address], [place] (336) 641-6152 Accepts children up to age 21 who are enrolled in Medicaid or [place] Health Choice; pregnant women with a Medicaid card; and children who have applied for Medicaid or [place] Health Choice, but were declined, whose parents can pay a reduced fee at time of service.  °[facility] [place]  [address], [place] (336) 641-7733 Accepts children up to age 21 who are enrolled in Medicaid or [place] Health Choice; pregnant women with a Medicaid card; and children who have applied for Medicaid or [place] Health Choice, but were declined, whose parents can pay a reduced fee at time of service.  °[facility] ° [address], [place] (336) 641-4533 Patients are seen by appointment only. Walk-ins are not accepted. [facility] will see patients 18 years of age and older. °Monday - Tuesday (8am-5pm) °Most Wednesdays (8:30-5pm) °$30 per visit, cash only  °[facility] ° [address], [place] (336) 641-4533 Patients are seen by appointment only. Walk-ins are not accepted. [facility] will see patients 18 years of age and older. °One  Wednesday Evening (Monthly: Volunteer Based).  $30 per visit, cash only  °[facility]  (919) 537-3737 for adults; Children under age 4, call Graduate Pediatric Dentistry at ([phone]) [phone]. Children aged 4-14, please call ([phone]) [phone] to request a pediatric application. ° Dental services are provided in all areas of dental care including fillings, crowns and bridges, complete and partial dentures, implants, gum treatment, root canals, and extractions. Preventive care is also provided. Treatment is provided to both adults and children. °Patients are selected via a lottery and there is often a waiting list. °  °[facility] [address], °[place] ° (336) 763-8833 [email or web address] °  °  [facility] [address], [place], [place] (336)723-1848, Ext. 123 Second and Fourth Thursday of each month, opens at 6:30 AM; Clinic ends at 9 AM.  Patients are seen on a first-come first-served basis, and a limited number are seen during each clinic.  ° °[facility] ° [address], [place], [place] (336) 723-7904   Eligibility Requirements °You must have lived in [place], [place], or [place] counties for at least the last three months. °  You cannot be eligible for state or federal sponsored healthcare insurance, including Veterans Administration, Medicaid, or Medicare. °  You generally cannot be eligible for healthcare insurance through your employer.  °  How to apply: °Eligibility screenings are held every Tuesday and Wednesday afternoon from 1:00 pm until 4:00 pm. You do not need an appointment for the interview!  °[facility] [address], [place], [place] 336-631-2330   °[facility]  336-342-8273   °[facility]  336-703-3100   °[facility]  336-570-6415   ° °Behavioral Health Resources in the Community: °Intensive Outpatient Programs °Organization          Address  Phone  Notes  °[facility] [address], [place], [place] 336-878-6098   °[facility] [address], [place], [place] 336-832-9800   °[facility] [address], [place], [place] ° 336-882-2125   °[facility] [address],  °[place], [place] 1-800-853-5163 or 336-641-4981   °Substance Abuse Resources °Organization         Address  Phone  Notes  °[facility]  336-882-2125   °[facility]  336-784-9470   °[facility]  336-285-9073   °[facility]  336-845-3988   °Residential & Outpatient Substance Abuse Program  1-800-659-3381   °Psychological Services °Organization         Address  Phone  Notes  °[facility]  336- 832-9600   °[facility]  336- 378-7881   °[facility] [address], [place] 1-800-853-5163 or 336-641-4981   ° °Mobile Crisis Teams °Organization         Address  Phone  Notes  °[facility], Mobile Crisis Care Unit  1-877-626-1772   °Assertive °[facility] ° [address] [place], [place] 336-834-9664   °Sharon DeEsch [address] °[place] 336-554-5454   ° °Self-Help/Support Groups °Organization         Address  Phone             Notes  °[facility] - variety of support groups  336- 373-1402 Call for more information  °Narcotics Anonymous (NA), [facility] [address], °[place]  2 meetings at this location  ° °Residential Treatment Programs °Organization         Address  Phone  Notes  °[facility] [address],    °[place]  1-866-801-8205   °[facility] ° [address], [place], [place] 704-293-8524   °[facility] [address], [place] 336-845-3988 Admissions: 8am-3pm M-F  °[facility] [address]    °[place], [place] 336-841-1104   °[facility] [address], , [place] 336-379-7146   °[facility] [address]  °,  336-285-9073   °[facility] - Intensive Outpatient [address], [place], [place] 336-852-3033   °[facility] [address]  °[place], [place] 1-877-615-2722 or 336-784-9470   °[facility] [address] [place], [place] 336-227-7417 Accepts Medicaid  °[facility] [address]  °[place] 1-800-659-3381 Substance Abuse/Addiction Treatment  ° °[place] Behavioral Health Resources °Organization         Address  Phone  Notes  °[facility]  (888) 581-9988   °Julie Brannon, PhD [address] [place], [place]   (336) 349-5553 or (336) 951-0000   °[facility]   [address] °[place], [place] (336) 349-4454   °[facility] [address], [place], [place] (336) 342-8316 Insurance/Medicaid/sponsorship through [facility]  °[facility] [address]                                    [place], [place] (336) 342-8316 Therapy/tele-psych/case  °[facility] [address]  ° [place], [place] (336) 349-2233    °Dr. Arfeen  (336) 349-4544   °[facility] [place]  [employer] [place] [facility] 1) [address], [place] °2) [address], [place] °3)  [address], [place] (336) 349-3220 °(336) 342-7768 ° °(336) 342-8140   °[place] [facility] (336) 342-1394 or (336) 342-3537 (After Hours)    ° °  °

## 2015-02-05 ENCOUNTER — Encounter: Payer: Self-pay | Admitting: Medical Oncology

## 2015-02-05 ENCOUNTER — Observation Stay
Admission: EM | Admit: 2015-02-05 | Discharge: 2015-02-05 | Discharge status: Against Medical Advice | Payer: Medicaid Other | Attending: Internal Medicine | Admitting: Internal Medicine

## 2015-02-05 DIAGNOSIS — G8929 Other chronic pain: Secondary | ICD-10-CM | POA: Insufficient documentation

## 2015-02-05 DIAGNOSIS — F1193 Opioid use, unspecified with withdrawal: Secondary | ICD-10-CM | POA: Diagnosis present

## 2015-02-05 DIAGNOSIS — E785 Hyperlipidemia, unspecified: Secondary | ICD-10-CM | POA: Diagnosis not present

## 2015-02-05 DIAGNOSIS — F419 Anxiety disorder, unspecified: Secondary | ICD-10-CM | POA: Diagnosis not present

## 2015-02-05 DIAGNOSIS — R1110 Vomiting, unspecified: Secondary | ICD-10-CM

## 2015-02-05 DIAGNOSIS — M542 Cervicalgia: Secondary | ICD-10-CM | POA: Insufficient documentation

## 2015-02-05 DIAGNOSIS — R109 Unspecified abdominal pain: Secondary | ICD-10-CM | POA: Diagnosis not present

## 2015-02-05 DIAGNOSIS — F1123 Opioid dependence with withdrawal: Principal | ICD-10-CM | POA: Insufficient documentation

## 2015-02-05 DIAGNOSIS — R112 Nausea with vomiting, unspecified: Secondary | ICD-10-CM | POA: Diagnosis not present

## 2015-02-05 DIAGNOSIS — F17200 Nicotine dependence, unspecified, uncomplicated: Secondary | ICD-10-CM | POA: Diagnosis not present

## 2015-02-05 HISTORY — DX: Cervicalgia: M54.2

## 2015-02-05 HISTORY — DX: Anxiety disorder, unspecified: F41.9

## 2015-02-05 HISTORY — DX: Other chronic pain: G89.29

## 2015-02-05 LAB — COMPREHENSIVE METABOLIC PANEL
ALK PHOS: 106 U/L (ref 38–126)
ALT: 18 U/L (ref 14–54)
ANION GAP: 8 (ref 5–15)
AST: 17 U/L (ref 15–41)
Albumin: 5.1 g/dL — ABNORMAL HIGH (ref 3.5–5.0)
BILIRUBIN TOTAL: 0.3 mg/dL (ref 0.3–1.2)
BUN: 12 mg/dL (ref 6–20)
CALCIUM: 9.7 mg/dL (ref 8.9–10.3)
CO2: 27 mmol/L (ref 22–32)
Chloride: 108 mmol/L (ref 101–111)
Creatinine, Ser: 0.93 mg/dL (ref 0.44–1.00)
GFR calc Af Amer: >60 mL/min (ref >60)
GFR calc non Af Amer: >60 mL/min (ref >60)
Glucose, Bld: 118 mg/dL — ABNORMAL HIGH (ref 65–99)
Potassium: 4.1 mmol/L (ref 3.5–5.1)
Sodium: 143 mmol/L (ref 135–145)
TOTAL PROTEIN: 9 g/dL — AB (ref 6.5–8.1)

## 2015-02-05 LAB — URINE DRUG SCREEN, QUALITATIVE (ARMC ONLY)
AMPHETAMINES, UR SCREEN: NOT DETECTED
BENZODIAZEPINE, UR SCRN: POSITIVE — AB
Barbiturates, Ur Screen: NOT DETECTED
CANNABINOID 50 NG, UR ~~LOC~~: NOT DETECTED
Cocaine Metabolite,Ur ~~LOC~~: NOT DETECTED
MDMA (ECSTASY) UR SCREEN: NOT DETECTED
Methadone Scn, Ur: NOT DETECTED
Opiate, Ur Screen: POSITIVE — AB
PHENCYCLIDINE (PCP) UR S: NOT DETECTED
TRICYCLIC, UR SCREEN: NOT DETECTED

## 2015-02-05 LAB — URINALYSIS COMPLETE WITH MICROSCOPIC (ARMC ONLY)
Bilirubin Urine: NEGATIVE
Glucose, UA: NEGATIVE mg/dL
Hgb urine dipstick: NEGATIVE
KETONES UR: NEGATIVE mg/dL
Leukocytes, UA: NEGATIVE
NITRITE: NEGATIVE
PH: 6 (ref 5.0–8.0)
PROTEIN: NEGATIVE mg/dL
SPECIFIC GRAVITY, URINE: 1.018 (ref 1.005–1.030)

## 2015-02-05 LAB — PREGNANCY, URINE: Preg Test, Ur: NEGATIVE

## 2015-02-05 LAB — CBC
HCT: 46.9 % (ref 35.0–47.0)
HEMOGLOBIN: 15.5 g/dL (ref 12.0–16.0)
MCH: 27.4 pg (ref 26.0–34.0)
MCHC: 33 g/dL (ref 32.0–36.0)
MCV: 83 fL (ref 80.0–100.0)
Platelets: 299 10*3/uL (ref 150–440)
RBC: 5.65 MIL/uL — ABNORMAL HIGH (ref 3.80–5.20)
RDW: 14.2 % (ref 11.5–14.5)
WBC: 10.6 10*3/uL (ref 3.6–11.0)

## 2015-02-05 LAB — LIPASE, BLOOD: Lipase: 48 U/L (ref 11–51)

## 2015-02-05 MED ORDER — SODIUM CHLORIDE 0.9 % IV SOLN
INTRAVENOUS | Status: DC
Start: 2015-02-05 — End: 2015-02-06
  Administered 2015-02-05: 20:00:00 via INTRAVENOUS

## 2015-02-05 MED ORDER — LORAZEPAM 2 MG/ML IJ SOLN
0.5000 mg | Freq: Once | INTRAMUSCULAR | Status: AC
  Administered 2015-02-05: 0.5 mg via INTRAVENOUS
  Filled 2015-02-05: qty 1

## 2015-02-05 MED ORDER — SODIUM CHLORIDE 0.9 % IV BOLUS (SEPSIS)
1000.0000 mL | Freq: Once | INTRAVENOUS | Status: AC
  Administered 2015-02-05: 1000 mL via INTRAVENOUS

## 2015-02-05 MED ORDER — PROMETHAZINE HCL 25 MG/ML IJ SOLN
25.0000 mg | Freq: Once | INTRAMUSCULAR | Status: AC
Start: 2015-02-05 — End: 2015-02-05
  Administered 2015-02-05: 25 mg via INTRAMUSCULAR
  Filled 2015-02-05 (×2): qty 1

## 2015-02-05 MED ORDER — ONDANSETRON HCL 4 MG/2ML IJ SOLN
4.0000 mg | Freq: Once | INTRAMUSCULAR | Status: AC
  Administered 2015-02-05: 4 mg via INTRAVENOUS
  Filled 2015-02-05: qty 2

## 2015-02-05 MED ORDER — NAPROXEN 250 MG PO TABS
500.0000 mg | ORAL_TABLET | Freq: Two times a day (BID) | ORAL | Status: DC
  Filled 2015-02-05: qty 2

## 2015-02-05 MED ORDER — BUPRENORPHINE HCL 8 MG SL SUBL: 8.0000 mg | SUBLINGUAL_TABLET | Freq: Every day | SUBLINGUAL | Status: DC

## 2015-02-05 MED ORDER — ONDANSETRON HCL 4 MG/2ML IJ SOLN
4.0000 mg | Freq: Once | INTRAMUSCULAR | Status: AC
  Administered 2015-02-05: 4 mg via INTRAVENOUS

## 2015-02-05 MED ORDER — PROMETHAZINE HCL 25 MG/ML IJ SOLN
25.0000 mg | Freq: Four times a day (QID) | INTRAMUSCULAR | Status: DC | PRN
  Administered 2015-02-05: 25 mg via INTRAVENOUS
  Filled 2015-02-05: qty 1

## 2015-02-05 MED ORDER — MORPHINE SULFATE (PF) 2 MG/ML IV SOLN
2.0000 mg | INTRAVENOUS | Status: DC | PRN
  Administered 2015-02-05: 2 mg via INTRAVENOUS
  Filled 2015-02-05: qty 1

## 2015-02-05 MED ORDER — SODIUM CHLORIDE 0.9 % IV BOLUS (SEPSIS)
2000.0000 mL | Freq: Once | INTRAVENOUS | Status: AC
  Administered 2015-02-05: 2000 mL via INTRAVENOUS

## 2015-02-05 MED ORDER — CLONIDINE HCL 0.1 MG PO TABS: 0.1000 mg | ORAL_TABLET | Freq: Four times a day (QID) | ORAL | Status: DC | PRN

## 2015-02-05 MED ORDER — PROMETHAZINE HCL 25 MG/ML IJ SOLN
25.0000 mg | Freq: Once | INTRAMUSCULAR | Status: AC
  Administered 2015-02-05: 25 mg via INTRAMUSCULAR
  Filled 2015-02-05: qty 1

## 2015-02-05 MED ORDER — LORAZEPAM 2 MG/ML IJ SOLN
0.5000 mg | Freq: Four times a day (QID) | INTRAMUSCULAR | Status: DC | PRN
  Filled 2015-02-05: qty 0.25

## 2015-02-05 MED ORDER — ZOLPIDEM TARTRATE 5 MG PO TABS: 5.0000 mg | ORAL_TABLET | Freq: Every evening | ORAL | Status: DC | PRN

## 2015-02-05 MED ORDER — ENOXAPARIN SODIUM 40 MG/0.4ML ~~LOC~~ SOLN
40.0000 mg | SUBCUTANEOUS | Status: DC
  Filled 2015-02-05: qty 0.4

## 2015-02-05 MED ORDER — LORAZEPAM 2 MG/ML IJ SOLN
0.5000 mg | Freq: Once | INTRAMUSCULAR | Status: AC
  Administered 2015-02-05: 0.5 mg via INTRAVENOUS

## 2015-02-05 MED ORDER — NICOTINE 21 MG/24HR TD PT24
21.0000 mg | MEDICATED_PATCH | Freq: Every day | TRANSDERMAL | Status: DC
  Filled 2015-02-05: qty 1

## 2015-02-05 MED ORDER — PROMETHAZINE HCL 25 MG/ML IJ SOLN
INTRAMUSCULAR | Status: AC
  Administered 2015-02-05: 25 mg via INTRAMUSCULAR
  Filled 2015-02-05: qty 1

## 2015-02-05 MED ORDER — ONDANSETRON HCL 4 MG/2ML IJ SOLN
INTRAMUSCULAR | Status: AC
  Administered 2015-02-05: 4 mg via INTRAVENOUS
  Filled 2015-02-05: qty 2
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
## 2015-02-05 NOTE — ED Notes (Signed)
Pt to ed with frerquent n/v pt states this has been going on for roughly a day but otherwise has not said much else due to dry heaves at time of assessment
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
## 2015-02-05 NOTE — ED Notes (Signed)
Pt reports she is a heroin addict and was in [facility] for treatment but pt has not been able to keep any food/liquids down for 3 days and has also been having diarrhea. Pt reports last used 3 days ago. Pt reports generalized aching. Pt denies SI/HI.

## 2015-02-05 NOTE — Progress Notes (Signed)
Pt left AMA with significant other at 2350. Dr. Clint GuyHower is aware. IV removed and bleeding at the site stopped before pt was allowed to leave. Pt report she felt better and think she will be able to control symptoms at home. No other signs of distress noted.
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
## 2016-04-17 ENCOUNTER — Encounter: Payer: Self-pay | Admitting: *Deleted

## 2016-04-17 ENCOUNTER — Emergency Department
Admission: EM | Admit: 2016-04-17 | Discharge: 2016-04-17 | Disposition: A | Discharge status: Home / Self Care | Payer: Medicaid Other | Attending: Emergency Medicine | Admitting: Emergency Medicine

## 2016-04-17 ENCOUNTER — Emergency Department: Payer: Medicaid Other

## 2016-04-17 DIAGNOSIS — G40909 Epilepsy, unspecified, not intractable, without status epilepticus: Secondary | ICD-10-CM | POA: Diagnosis not present

## 2016-04-17 DIAGNOSIS — R569 Unspecified convulsions: Secondary | ICD-10-CM

## 2016-04-17 DIAGNOSIS — F909 Attention-deficit hyperactivity disorder, unspecified type: Secondary | ICD-10-CM | POA: Diagnosis not present

## 2016-04-17 DIAGNOSIS — Z79899 Other long term (current) drug therapy: Secondary | ICD-10-CM | POA: Insufficient documentation

## 2016-04-17 DIAGNOSIS — F17210 Nicotine dependence, cigarettes, uncomplicated: Secondary | ICD-10-CM | POA: Diagnosis not present

## 2016-04-17 LAB — CBC WITH DIFFERENTIAL/PLATELET
BASOS PCT: 1 %
Basophils Absolute: 0.1 10*3/uL (ref 0–0.1)
Eosinophils Absolute: 0.1 10*3/uL (ref 0–0.7)
Eosinophils Relative: 1 %
HEMATOCRIT: 43.9 % (ref 35.0–47.0)
HEMOGLOBIN: 14.8 g/dL (ref 12.0–16.0)
LYMPHS ABS: 3.2 10*3/uL (ref 1.0–3.6)
Lymphocytes Relative: 26 %
MCH: 27.9 pg (ref 26.0–34.0)
MCHC: 33.8 g/dL (ref 32.0–36.0)
MCV: 82.8 fL (ref 80.0–100.0)
MONOS PCT: 6 %
Monocytes Absolute: 0.7 10*3/uL (ref 0.2–0.9)
NEUTROS ABS: 8.4 10*3/uL — AB (ref 1.4–6.5)
NEUTROS PCT: 66 %
Platelets: 337 10*3/uL (ref 150–440)
RBC: 5.3 MIL/uL — AB (ref 3.80–5.20)
RDW: 13.7 % (ref 11.5–14.5)
WBC: 12.4 10*3/uL — AB (ref 3.6–11.0)

## 2016-04-17 LAB — URINALYSIS, COMPLETE (UACMP) WITH MICROSCOPIC
BILIRUBIN URINE: NEGATIVE
GLUCOSE, UA: NEGATIVE mg/dL
KETONES UR: 5 mg/dL — AB
LEUKOCYTES UA: NEGATIVE
Nitrite: NEGATIVE
PH: 5 (ref 5.0–8.0)
Protein, ur: 100 mg/dL — AB
Specific Gravity, Urine: 1.02 (ref 1.005–1.030)

## 2016-04-17 LAB — COMPREHENSIVE METABOLIC PANEL
ALK PHOS: 81 U/L (ref 38–126)
ALT: 47 U/L (ref 14–54)
ANION GAP: 15 (ref 5–15)
AST: 37 U/L (ref 15–41)
Albumin: 4.5 g/dL (ref 3.5–5.0)
BILIRUBIN TOTAL: 0.6 mg/dL (ref 0.3–1.2)
BUN: 20 mg/dL (ref 6–20)
CALCIUM: 9.3 mg/dL (ref 8.9–10.3)
CO2: 17 mmol/L — ABNORMAL LOW (ref 22–32)
Chloride: 108 mmol/L (ref 101–111)
Creatinine, Ser: 0.84 mg/dL (ref 0.44–1.00)
GFR calc non Af Amer: >60 mL/min (ref >60)
GLUCOSE: 114 mg/dL — AB (ref 65–99)
Potassium: 3.3 mmol/L — ABNORMAL LOW (ref 3.5–5.1)
Sodium: 140 mmol/L (ref 135–145)
TOTAL PROTEIN: 8.1 g/dL (ref 6.5–8.1)

## 2016-04-17 LAB — URINE DRUG SCREEN, QUALITATIVE (ARMC ONLY)
Amphetamines, Ur Screen: NOT DETECTED
BARBITURATES, UR SCREEN: NOT DETECTED
BENZODIAZEPINE, UR SCRN: POSITIVE — AB
CANNABINOID 50 NG, UR ~~LOC~~: NOT DETECTED
Cocaine Metabolite,Ur ~~LOC~~: NOT DETECTED
MDMA (Ecstasy)Ur Screen: NOT DETECTED
Methadone Scn, Ur: NOT DETECTED
OPIATE, UR SCREEN: NOT DETECTED
PHENCYCLIDINE (PCP) UR S: NOT DETECTED
Tricyclic, Ur Screen: NOT DETECTED

## 2016-04-17 LAB — PREGNANCY, URINE: PREG TEST UR: NEGATIVE

## 2016-04-17 MED ORDER — LEVETIRACETAM 500 MG PO TABS
1000.0000 mg | ORAL_TABLET | Freq: Once | ORAL | Status: AC
  Administered 2016-04-17: 1000 mg via ORAL

## 2016-04-17 MED ORDER — LEVETIRACETAM 500 MG PO TABS: 500.0000 mg | ORAL_TABLET | Freq: Two times a day (BID) | ORAL | 1 refills | Status: DC

## 2016-04-17 MED ORDER — SODIUM CHLORIDE 0.9 % IV SOLN
1000.0000 mg | Freq: Once | INTRAVENOUS | Status: AC
  Administered 2016-04-17: 1000 mg via INTRAVENOUS
  Filled 2016-04-17: qty 10

## 2016-04-17 MED ORDER — LEVETIRACETAM 500 MG PO TABS
ORAL_TABLET | ORAL | Status: AC
  Filled 2016-04-17: qty 2
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
## 2016-04-17 NOTE — Discharge Instructions (Signed)
Please follow-up with Dr. Malvin JohnsPotter or Dr. Sherryll BurgerShah the neurologists we have in town. Or you can see a neurologist to be choosing. I spoke with our neurologist on-call today. He recommends taking Keppra twice a day until we can have you follow up with outpatient neurology. Please return for any further problems.

## 2016-04-17 NOTE — ED Notes (Signed)
Family and md in with pt now.

## 2016-04-17 NOTE — ED Notes (Signed)
Pt alert, watching tv.  siderails up x 2.

## 2016-04-17 NOTE — ED Notes (Addendum)
Pt brought in via ems from home with a witnessed seizure.  Last sz 7-10 years ago.  Hx drug abuse.  No etoh use.  Pt alert on arrival  Iv in place. urinary incontinence on arrival to er.   Pt did not bite tongue.

## 2016-04-17 NOTE — ED Notes (Signed)
Pt's iv infiltrated.  Pt refused another iv start to receive iv keppra.  md aware.  Po meds given instead.  D/c inst to pt.

## 2016-04-17 NOTE — ED Notes (Signed)
Pt signing out ama, pt did not want iv keppra.

## 2016-04-17 NOTE — ED Triage Notes (Signed)
Pt brought in via ems from home with witnessed seizure.  Hx drug abuse and seizure.  Pt alert.  Iv in place.  md  With pt.

## 2016-05-02 ENCOUNTER — Telehealth: Payer: Self-pay | Admitting: Emergency Medicine

## 2016-05-02 NOTE — Telephone Encounter (Signed)
Pt left message asking for referral to [facility].  Called office and they will contact patient for appt,.

## 2016-05-11 ENCOUNTER — Telehealth: Payer: Self-pay | Admitting: *Deleted

## 2016-05-11 ENCOUNTER — Ambulatory Visit: Payer: Medicaid Other | Admitting: Neurology

## 2016-05-11 NOTE — Telephone Encounter (Signed)
Canceled new patient appt same day as scheduled - death in family.

## 2016-05-15 ENCOUNTER — Ambulatory Visit: Payer: Medicaid Other | Admitting: Neurology

## 2016-05-15 ENCOUNTER — Telehealth: Payer: Self-pay | Admitting: *Deleted

## 2016-05-15 NOTE — Telephone Encounter (Signed)
No showed new patient appointment. 

## 2016-05-16 ENCOUNTER — Encounter: Payer: Self-pay | Admitting: Neurology

## 2016-11-06 ENCOUNTER — Ambulatory Visit: Payer: Self-pay | Admitting: Obstetrics and Gynecology

## 2016-11-16 ENCOUNTER — Other Ambulatory Visit: Payer: Self-pay | Admitting: Obstetrics and Gynecology

## 2016-11-16 ENCOUNTER — Ambulatory Visit (INDEPENDENT_AMBULATORY_CARE_PROVIDER_SITE_OTHER): Payer: Medicaid Other | Admitting: Obstetrics and Gynecology

## 2016-11-16 ENCOUNTER — Encounter: Payer: Self-pay | Admitting: Obstetrics and Gynecology

## 2016-11-16 VITALS — BP 112/72 | HR 99 | Ht 62.5 in | Wt 168.0 lb

## 2016-11-16 DIAGNOSIS — R102 Pelvic and perineal pain: Secondary | ICD-10-CM | POA: Diagnosis not present

## 2016-11-16 DIAGNOSIS — N912 Amenorrhea, unspecified: Secondary | ICD-10-CM | POA: Diagnosis not present

## 2016-11-16 LAB — POCT URINE PREGNANCY: Preg Test, Ur: NEGATIVE

## 2016-11-16 MED ORDER — MEDROXYPROGESTERONE ACETATE 10 MG PO TABS: 10.0000 mg | ORAL_TABLET | Freq: Every day | ORAL | 0 refills | Status: DC
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
## 2016-11-20 LAB — HEMOGLOBIN A1C
ESTIMATED AVERAGE GLUCOSE: 114 mg/dL
HEMOGLOBIN A1C: 5.6 % (ref 4.8–5.6)

## 2016-11-20 LAB — TSH: TSH: 1.68 u[IU]/mL (ref 0.450–4.500)

## 2016-11-20 LAB — PROGESTERONE: Progesterone: 0.3 ng/mL

## 2016-11-20 LAB — ANDROSTENEDIONE: Androstenedione: 279 ng/dL — ABNORMAL HIGH (ref 41–262)

## 2016-11-20 LAB — TESTOSTERONE,FREE AND TOTAL
TESTOSTERONE: 66 ng/dL — AB (ref 8–48)
Testosterone, Free: 1.7 pg/mL (ref 0.0–4.2)

## 2016-11-20 LAB — FSH/LH
FSH: 6.5 m[IU]/mL
LH: 22.4 m[IU]/mL

## 2016-11-20 LAB — PROLACTIN: Prolactin: 14.5 ng/mL (ref 4.8–23.3)

## 2016-11-20 LAB — ESTRADIOL: Estradiol: 75.6 pg/mL

## 2016-11-20 LAB — DHEA-SULFATE: DHEA-SO4: 263.4 ug/dL (ref 84.8–378.0)

## 2016-11-22 ENCOUNTER — Telehealth: Payer: Self-pay | Admitting: Obstetrics and Gynecology
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
## 2016-12-01 ENCOUNTER — Ambulatory Visit: Payer: Self-pay | Admitting: Obstetrics & Gynecology

## 2016-12-13 ENCOUNTER — Encounter: Payer: Self-pay | Admitting: Obstetrics and Gynecology

## 2016-12-13 ENCOUNTER — Ambulatory Visit: Payer: Self-pay | Admitting: Obstetrics and Gynecology

## 2017-01-01 ENCOUNTER — Ambulatory Visit: Payer: Self-pay | Admitting: Advanced Practice Midwife

## 2017-05-17 MED ORDER — LOPERAMIDE HCL 2 MG PO CAPS
2.00 | ORAL_CAPSULE | ORAL | Status: DC
Start: ? — End: 2017-05-17

## 2017-05-17 MED ORDER — HYDROXYZINE HCL 25 MG PO TABS
50.00 | ORAL_TABLET | ORAL | Status: DC
Start: ? — End: 2017-05-17

## 2017-05-17 MED ORDER — METHOCARBAMOL 750 MG PO TABS
1500.00 | ORAL_TABLET | ORAL | Status: DC
Start: ? — End: 2017-05-17

## 2017-05-17 MED ORDER — PHENOBARBITAL 32.4 MG PO TABS
16.20 | ORAL_TABLET | ORAL | Status: DC
Start: 2017-05-17 — End: 2017-05-17

## 2017-05-17 MED ORDER — GENERIC EXTERNAL MEDICATION
Status: DC
Start: ? — End: 2017-05-17

## 2017-05-17 MED ORDER — NICOTINE 14 MG/24HR TD PT24
1.00 | MEDICATED_PATCH | TRANSDERMAL | Status: DC
Start: 2017-05-18 — End: 2017-05-17

## 2017-05-17 MED ORDER — IBUPROFEN 400 MG PO TABS
400.00 | ORAL_TABLET | ORAL | Status: DC
Start: ? — End: 2017-05-17

## 2017-05-17 MED ORDER — LORAZEPAM 2 MG/ML IJ SOLN
2.00 | INTRAMUSCULAR | Status: DC
Start: ? — End: 2017-05-17

## 2017-05-17 MED ORDER — LEVETIRACETAM 500 MG PO TABS
500.00 | ORAL_TABLET | ORAL | Status: DC
Start: 2017-05-17 — End: 2017-05-17

## 2017-05-17 MED ORDER — GENERIC EXTERNAL MEDICATION
1.00 | Status: DC
Start: 2017-05-18 — End: 2017-05-17

## 2017-05-17 MED ORDER — BISACODYL 5 MG PO TBEC
10.00 | DELAYED_RELEASE_TABLET | ORAL | Status: DC
Start: ? — End: 2017-05-17

## 2017-05-17 MED ORDER — DOCUSATE SODIUM 100 MG PO CAPS
100.00 | ORAL_CAPSULE | ORAL | Status: DC
Start: ? — End: 2017-05-17

## 2017-05-17 MED ORDER — THIAMINE HCL 100 MG PO TABS
100.00 | ORAL_TABLET | ORAL | Status: DC
Start: 2017-05-18 — End: 2017-05-17

## 2017-05-17 MED ORDER — HALOPERIDOL 5 MG PO TABS
5.00 | ORAL_TABLET | ORAL | Status: DC
Start: ? — End: 2017-05-17

## 2017-05-17 MED ORDER — ALUMINUM-MAGNESIUM-SIMETHICONE 200-200-20 MG/5ML PO SUSP
30.00 | ORAL | Status: DC
Start: ? — End: 2017-05-17

## 2017-05-17 MED ORDER — GENERIC EXTERNAL MEDICATION
5.00 | Status: DC
Start: ? — End: 2017-05-17

## 2017-05-17 MED ORDER — TRAZODONE HCL 100 MG PO TABS
100.00 | ORAL_TABLET | ORAL | Status: DC
Start: ? — End: 2017-05-17

## 2017-05-17 MED ORDER — CLONIDINE HCL 0.1 MG PO TABS
0.10 | ORAL_TABLET | ORAL | Status: DC
Start: ? — End: 2017-05-17

## 2017-05-17 MED ORDER — GUAIFENESIN 100 MG/5ML PO SYRP
200.00 | ORAL_SOLUTION | ORAL | Status: DC
Start: ? — End: 2017-05-17

## 2017-05-17 MED ORDER — GENERIC EXTERNAL MEDICATION
1.00 | Status: DC
Start: ? — End: 2017-05-17

## 2017-05-17 MED ORDER — DULOXETINE HCL 30 MG PO CPEP
30.00 | ORAL_CAPSULE | ORAL | Status: DC
Start: 2017-05-17 — End: 2017-05-17

## 2017-05-17 MED ORDER — BENZOCAINE-MENTHOL 6-10 MG MT LOZG
1.00 | LOZENGE | OROMUCOSAL | Status: DC
Start: ? — End: 2017-05-17

## 2017-05-17 MED ORDER — ZIPRASIDONE MESYLATE 20 MG IM SOLR
20.00 | INTRAMUSCULAR | Status: DC
Start: ? — End: 2017-05-17

## 2017-05-17 MED ORDER — PROMETHAZINE HCL 25 MG PO TABS
25.00 | ORAL_TABLET | ORAL | Status: DC
Start: ? — End: 2017-05-17

## 2017-05-17 MED ORDER — DIPHENHYDRAMINE HCL 50 MG/ML IJ SOLN
50.00 | INTRAMUSCULAR | Status: DC
Start: ? — End: 2017-05-17

## 2017-07-08 ENCOUNTER — Emergency Department
Admission: EM | Admit: 2017-07-08 | Discharge: 2017-07-08 | Disposition: A | Discharge status: Home / Self Care | Payer: Medicaid Other | Attending: Emergency Medicine | Admitting: Emergency Medicine

## 2017-07-08 ENCOUNTER — Emergency Department: Payer: Medicaid Other

## 2017-07-08 ENCOUNTER — Encounter: Payer: Self-pay | Admitting: Emergency Medicine

## 2017-07-08 ENCOUNTER — Other Ambulatory Visit: Payer: Self-pay

## 2017-07-08 DIAGNOSIS — F17210 Nicotine dependence, cigarettes, uncomplicated: Secondary | ICD-10-CM | POA: Diagnosis not present

## 2017-07-08 DIAGNOSIS — Z79899 Other long term (current) drug therapy: Secondary | ICD-10-CM | POA: Diagnosis not present

## 2017-07-08 DIAGNOSIS — M545 Low back pain: Secondary | ICD-10-CM | POA: Diagnosis not present

## 2017-07-08 LAB — POCT PREGNANCY, URINE: Preg Test, Ur: NEGATIVE

## 2017-07-08 MED ORDER — KETOROLAC TROMETHAMINE 30 MG/ML IJ SOLN
30.0000 mg | Freq: Once | INTRAMUSCULAR | Status: AC
  Administered 2017-07-08: 30 mg via INTRAMUSCULAR
  Filled 2017-07-08: qty 1

## 2017-07-08 MED ORDER — LIDOCAINE 5 % EX PTCH
1.0000 | MEDICATED_PATCH | CUTANEOUS | Status: DC
  Administered 2017-07-08: 1 via TRANSDERMAL
  Filled 2017-07-08: qty 1

## 2017-07-08 MED ORDER — LIDOCAINE 5 % EX PTCH: 1.0000 | MEDICATED_PATCH | Freq: Two times a day (BID) | CUTANEOUS | 0 refills | Status: DC

## 2017-07-08 MED ORDER — CYCLOBENZAPRINE HCL 5 MG PO TABS: ORAL_TABLET | ORAL | 0 refills | Status: DC

## 2017-07-08 MED ORDER — KETOROLAC TROMETHAMINE 10 MG PO TABS: 10.0000 mg | ORAL_TABLET | Freq: Four times a day (QID) | ORAL | 0 refills | Status: DC | PRN

## 2017-07-08 NOTE — ED Triage Notes (Signed)
Patient presents to the ED with back pain after falling while roller skating approx. 1 week ago.  Patient has history of chronic back pain and states this injury has made her back pain worse.
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
## 2017-08-13 MED ORDER — ALUMINUM-MAGNESIUM-SIMETHICONE 200-200-20 MG/5ML PO SUSP
30.00 | ORAL | Status: DC
Start: ? — End: 2017-08-13

## 2017-08-13 MED ORDER — DOCUSATE SODIUM 100 MG PO CAPS
100.00 | ORAL_CAPSULE | ORAL | Status: DC
Start: ? — End: 2017-08-13

## 2017-08-13 MED ORDER — NICOTINE POLACRILEX 2 MG MT GUM
2.00 | CHEWING_GUM | OROMUCOSAL | Status: DC
Start: ? — End: 2017-08-13

## 2017-08-13 MED ORDER — IBUPROFEN 400 MG PO TABS
400.00 | ORAL_TABLET | ORAL | Status: DC
Start: ? — End: 2017-08-13

## 2017-08-13 MED ORDER — DULOXETINE HCL 30 MG PO CPEP
60.00 | ORAL_CAPSULE | ORAL | Status: DC
Start: 2017-08-13 — End: 2017-08-13

## 2017-08-13 MED ORDER — HYDROXYZINE HCL 25 MG PO TABS
50.00 | ORAL_TABLET | ORAL | Status: DC
Start: ? — End: 2017-08-13

## 2017-08-13 MED ORDER — LEVETIRACETAM 500 MG PO TABS
500.00 | ORAL_TABLET | ORAL | Status: DC
Start: 2017-08-13 — End: 2017-08-13

## 2017-08-13 MED ORDER — QUETIAPINE FUMARATE 200 MG PO TABS
400.00 | ORAL_TABLET | ORAL | Status: DC
Start: 2017-08-13 — End: 2017-08-13

## 2017-08-13 MED ORDER — GENERIC EXTERNAL MEDICATION
5.00 | Status: DC
Start: ? — End: 2017-08-13

## 2017-08-13 MED ORDER — CARBAMAZEPINE 100 MG PO CHEW
100.00 | CHEWABLE_TABLET | ORAL | Status: DC
Start: 2017-08-13 — End: 2017-08-13

## 2017-08-13 MED ORDER — NICOTINE 14 MG/24HR TD PT24
1.00 | MEDICATED_PATCH | TRANSDERMAL | Status: DC
Start: 2017-08-14 — End: 2017-08-13

## 2017-08-13 MED ORDER — CLONAZEPAM 0.5 MG PO TABS
0.50 | ORAL_TABLET | ORAL | Status: DC
Start: 2017-08-13 — End: 2017-08-13

## 2017-08-13 MED ORDER — CLONIDINE HCL 0.1 MG PO TABS
0.10 | ORAL_TABLET | ORAL | Status: DC
Start: ? — End: 2017-08-13

## 2017-08-13 MED ORDER — GENERIC EXTERNAL MEDICATION
Status: DC
Start: ? — End: 2017-08-13

## 2017-08-13 MED ORDER — TRAZODONE HCL 100 MG PO TABS
100.00 | ORAL_TABLET | ORAL | Status: DC
Start: ? — End: 2017-08-13

## 2017-08-13 MED ORDER — METHOCARBAMOL 750 MG PO TABS
1500.00 | ORAL_TABLET | ORAL | Status: DC
Start: 2017-08-13 — End: 2017-08-13

## 2017-08-13 MED ORDER — CLONIDINE HCL 0.2 MG PO TABS
0.20 | ORAL_TABLET | ORAL | Status: DC
Start: 2017-08-13 — End: 2017-08-13

## 2017-08-13 MED ORDER — HALOPERIDOL 5 MG PO TABS
5.00 | ORAL_TABLET | ORAL | Status: DC
Start: ? — End: 2017-08-13

## 2018-01-04 ENCOUNTER — Other Ambulatory Visit: Payer: Self-pay

## 2018-01-04 ENCOUNTER — Encounter: Payer: Self-pay | Admitting: *Deleted

## 2018-01-04 ENCOUNTER — Inpatient Hospital Stay
Admission: EM | Admit: 2018-01-04 | Discharge: 2018-01-05 | DRG: 603 | Disposition: A | Discharge status: Home / Self Care | Payer: Medicaid Other | Attending: Internal Medicine | Admitting: Internal Medicine

## 2018-01-04 DIAGNOSIS — L03115 Cellulitis of right lower limb: Secondary | ICD-10-CM | POA: Diagnosis present

## 2018-01-04 DIAGNOSIS — F1910 Other psychoactive substance abuse, uncomplicated: Secondary | ICD-10-CM | POA: Diagnosis present

## 2018-01-04 DIAGNOSIS — L03114 Cellulitis of left upper limb: Secondary | ICD-10-CM | POA: Diagnosis present

## 2018-01-04 DIAGNOSIS — L02415 Cutaneous abscess of right lower limb: Secondary | ICD-10-CM | POA: Diagnosis present

## 2018-01-04 DIAGNOSIS — L02414 Cutaneous abscess of left upper limb: Principal | ICD-10-CM | POA: Diagnosis present

## 2018-01-04 DIAGNOSIS — F17210 Nicotine dependence, cigarettes, uncomplicated: Secondary | ICD-10-CM | POA: Diagnosis present

## 2018-01-04 DIAGNOSIS — F909 Attention-deficit hyperactivity disorder, unspecified type: Secondary | ICD-10-CM | POA: Diagnosis present

## 2018-01-04 DIAGNOSIS — Z79899 Other long term (current) drug therapy: Secondary | ICD-10-CM

## 2018-01-04 DIAGNOSIS — Z793 Long term (current) use of hormonal contraceptives: Secondary | ICD-10-CM

## 2018-01-04 DIAGNOSIS — F329 Major depressive disorder, single episode, unspecified: Secondary | ICD-10-CM | POA: Diagnosis present

## 2018-01-04 DIAGNOSIS — E785 Hyperlipidemia, unspecified: Secondary | ICD-10-CM | POA: Diagnosis present

## 2018-01-04 DIAGNOSIS — M545 Low back pain: Secondary | ICD-10-CM | POA: Diagnosis present

## 2018-01-04 DIAGNOSIS — L0390 Cellulitis, unspecified: Secondary | ICD-10-CM

## 2018-01-04 DIAGNOSIS — G8929 Other chronic pain: Secondary | ICD-10-CM | POA: Diagnosis present

## 2018-01-04 DIAGNOSIS — F419 Anxiety disorder, unspecified: Secondary | ICD-10-CM | POA: Diagnosis present

## 2018-01-04 DIAGNOSIS — L0291 Cutaneous abscess, unspecified: Secondary | ICD-10-CM

## 2018-01-04 LAB — CBC WITH DIFFERENTIAL/PLATELET
Abs Immature Granulocytes: 0.09 10*3/uL — ABNORMAL HIGH (ref 0.00–0.07)
BASOS ABS: 0 10*3/uL (ref 0.0–0.1)
BASOS PCT: 0 %
EOS ABS: 0.2 10*3/uL (ref 0.0–0.5)
Eosinophils Relative: 1 %
HCT: 35.1 % — ABNORMAL LOW (ref 36.0–46.0)
Hemoglobin: 11.4 g/dL — ABNORMAL LOW (ref 12.0–15.0)
IMMATURE GRANULOCYTES: 1 %
Lymphocytes Relative: 18 %
Lymphs Abs: 2.2 10*3/uL (ref 0.7–4.0)
MCH: 26.5 pg (ref 26.0–34.0)
MCHC: 32.5 g/dL (ref 30.0–36.0)
MCV: 81.4 fL (ref 80.0–100.0)
Monocytes Absolute: 0.8 10*3/uL (ref 0.1–1.0)
Monocytes Relative: 7 %
Neutro Abs: 9 10*3/uL — ABNORMAL HIGH (ref 1.7–7.7)
Neutrophils Relative %: 73 %
Platelets: 376 10*3/uL (ref 150–400)
RBC: 4.31 MIL/uL (ref 3.87–5.11)
RDW: 12.9 % (ref 11.5–15.5)
WBC: 12.3 10*3/uL — AB (ref 4.0–10.5)
nRBC: 0 % (ref 0.0–0.2)

## 2018-01-04 LAB — COMPREHENSIVE METABOLIC PANEL
ALBUMIN: 3.1 g/dL — AB (ref 3.5–5.0)
ALK PHOS: 140 U/L — AB (ref 38–126)
ALT: 17 U/L (ref 0–44)
AST: 19 U/L (ref 15–41)
Anion gap: 9 (ref 5–15)
BILIRUBIN TOTAL: 0.4 mg/dL (ref 0.3–1.2)
BUN: 7 mg/dL (ref 6–20)
CALCIUM: 8.7 mg/dL — AB (ref 8.9–10.3)
CO2: 28 mmol/L (ref 22–32)
Chloride: 104 mmol/L (ref 98–111)
Creatinine, Ser: 0.5 mg/dL (ref 0.44–1.00)
GFR calc Af Amer: >60 mL/min (ref >60)
Glucose, Bld: 119 mg/dL — ABNORMAL HIGH (ref 70–99)
Potassium: 3.5 mmol/L (ref 3.5–5.1)
Sodium: 141 mmol/L (ref 135–145)
TOTAL PROTEIN: 7.6 g/dL (ref 6.5–8.1)

## 2018-01-04 LAB — HCG, QUANTITATIVE, PREGNANCY

## 2018-01-04 LAB — CG4 I-STAT (LACTIC ACID): Lactic Acid, Venous: 1.01 mmol/L (ref 0.5–1.9)

## 2018-01-04 MED ORDER — ACETAMINOPHEN 325 MG PO TABS
650.0000 mg | ORAL_TABLET | Freq: Once | ORAL | Status: AC
  Administered 2018-01-04: 650 mg via ORAL
  Filled 2018-01-04: qty 2

## 2018-01-04 MED ORDER — ACETAMINOPHEN 650 MG RE SUPP: 650.0000 mg | Freq: Four times a day (QID) | RECTAL | Status: DC | PRN

## 2018-01-04 MED ORDER — ALBUTEROL SULFATE (2.5 MG/3ML) 0.083% IN NEBU: 2.5000 mg | INHALATION_SOLUTION | RESPIRATORY_TRACT | Status: DC | PRN

## 2018-01-04 MED ORDER — ALPRAZOLAM 1 MG PO TABS
1.0000 mg | ORAL_TABLET | Freq: Three times a day (TID) | ORAL | Status: DC | PRN
  Administered 2018-01-05 (×2): 1 mg via ORAL
  Filled 2018-01-04 (×2): qty 1

## 2018-01-04 MED ORDER — LIDOCAINE HCL (PF) 1 % IJ SOLN
5.0000 mL | Freq: Once | INTRAMUSCULAR | Status: AC
  Administered 2018-01-04: 5 mL
  Filled 2018-01-04: qty 5

## 2018-01-04 MED ORDER — ENOXAPARIN SODIUM 40 MG/0.4ML ~~LOC~~ SOLN
40.0000 mg | SUBCUTANEOUS | Status: DC
  Administered 2018-01-04: 40 mg via SUBCUTANEOUS
  Filled 2018-01-04: qty 0.4

## 2018-01-04 MED ORDER — VANCOMYCIN HCL IN DEXTROSE 1-5 GM/200ML-% IV SOLN
1000.0000 mg | Freq: Once | INTRAVENOUS | Status: AC
  Administered 2018-01-04: 1000 mg via INTRAVENOUS
  Filled 2018-01-04: qty 200

## 2018-01-04 MED ORDER — INFLUENZA VAC SPLIT QUAD 0.5 ML IM SUSY: 0.5000 mL | PREFILLED_SYRINGE | INTRAMUSCULAR | Status: DC

## 2018-01-04 MED ORDER — VANCOMYCIN HCL IN DEXTROSE 1-5 GM/200ML-% IV SOLN
1000.0000 mg | Freq: Three times a day (TID) | INTRAVENOUS | Status: DC
  Administered 2018-01-04 – 2018-01-05 (×2): 1000 mg via INTRAVENOUS
  Filled 2018-01-04 (×4): qty 200

## 2018-01-04 MED ORDER — KETOROLAC TROMETHAMINE 15 MG/ML IJ SOLN
15.0000 mg | Freq: Four times a day (QID) | INTRAMUSCULAR | Status: DC | PRN
  Administered 2018-01-04: 20:00:00 15 mg via INTRAVENOUS
  Filled 2018-01-04 (×2): qty 1

## 2018-01-04 MED ORDER — BISACODYL 10 MG RE SUPP
10.0000 mg | Freq: Every day | RECTAL | Status: DC | PRN
  Filled 2018-01-04: qty 1

## 2018-01-04 MED ORDER — IBUPROFEN 400 MG PO TABS: 400.0000 mg | ORAL_TABLET | Freq: Four times a day (QID) | ORAL | Status: DC | PRN

## 2018-01-04 MED ORDER — ACETAMINOPHEN 325 MG PO TABS: 650.0000 mg | ORAL_TABLET | Freq: Four times a day (QID) | ORAL | Status: DC | PRN

## 2018-01-04 NOTE — ED Notes (Signed)
Pt left facility while RN giving report. States she wanted to smoke. Came back to room. Understands she will be moved to room 111 on the floor.

## 2018-01-04 NOTE — ED Notes (Signed)
Pt sleeping. 

## 2018-01-04 NOTE — ED Notes (Signed)
Pt angry. Yelling obscenities. States she does not want to stay. Educated she has the right to leave if she wants to but that it is not recommended. Educated on seriousness of situation & need for continued antibiotics. Pt notified she has a bed on the floor if she is willing to stay.

## 2018-01-04 NOTE — ED Notes (Signed)
EDP Derrill KayGoodman notified in person that pt is refusing IV access in arms d/t abscesses.

## 2018-01-04 NOTE — Consult Note (Signed)
Pharmacy Antibiotic Note  Chelsea Hayes is a 37 y.o. female admitted on 01/04/2018 with cellulitis.  Pharmacy has been consulted for vancomycin dosing. 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  Plan: Vancomycin 1000mg  IV every 8 hours beginning 6 hours after first 1000mg  dose in the ED.    Ke: 0.087  T1/2: 8h  Vd: 60L  Calculated concentrations at steady-state: 29.5/16.0 mcg/mL  VT prior to 5th dose  Goal trough 15-20 mcg/mL.  Height: 5\' 2"  (157.5 cm) Weight: 190 lb (86.2 kg) IBW/kg (Calculated) : 50.1  Temp (24hrs), Avg:98.7 F (37.1 C), Min:98.7 F (37.1 C), Max:98.7 F (37.1 C)  Recent Labs  Lab 01/04/18 1423 01/04/18 1603  WBC 12.3*  --   CREATININE 0.50  --   LATICACIDVEN  --  1.01    Estimated Creatinine Clearance: 99 mL/min (by C-G formula based on SCr of 0.5 mg/dL).    No Known Allergies  Antimicrobials this admission: vancomycin 11/29 >>   Microbiology results: None currently active  Thank you for allowing pharmacy to be a part of this patient's care.  Lowella Bandyodney D Nnamdi Dacus, PharmD 01/04/2018 5:40 PM

## 2018-01-04 NOTE — ED Notes (Signed)
Unsuccessful with foot stick as pt yelled to "remove it now" as attempting stick.

## 2018-01-04 NOTE — ED Notes (Addendum)
Pt reporting new onset of back and chest pain in triage after being told about wait times. Pt refusing BP due to abscess and pain. PT also refusing blood work at this time stating, "I have like no veins"

## 2018-01-04 NOTE — ED Triage Notes (Signed)
Pt to ED reporting multiple injection sites of "ICE" over a week ago. Accesses have formed in every injection site and have now started to form on pts face and developed into lower spinal pain per pt. Pt tearful in triage but denies fever. Last use of heroine was 2 days.

## 2018-01-04 NOTE — ED Notes (Signed)
Pt has abscesses all over arms; OTA; one on L arm is open - tissue is pink/red/yellow.

## 2018-01-04 NOTE — ED Notes (Signed)
Pt ambulating out of department in hospital gown. [facility] if [facility] [place]. Pt states "im going to find my dad outside". Pt instructed that needs to stay in department and we will send someone to get father. Pt showed back to room by [facility] and instructed to inform nurse.
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
## 2018-01-04 NOTE — Progress Notes (Signed)
Vital signs obtained, patient in no acute distress. Report given to night shift RN.

## 2018-01-04 NOTE — ED Notes (Signed)
Staff at bedside looking for access at pt's neck.

## 2018-01-04 NOTE — ED Notes (Signed)
Verbal order Derrill KayGoodman to stick pt's feet for IV access/labs.

## 2018-01-05 LAB — URINE DRUG SCREEN, QUALITATIVE (ARMC ONLY)
Amphetamines, Ur Screen: NOT DETECTED
BENZODIAZEPINE, UR SCRN: POSITIVE — AB
Barbiturates, Ur Screen: NOT DETECTED
COCAINE METABOLITE, UR ~~LOC~~: NOT DETECTED
Cannabinoid 50 Ng, Ur ~~LOC~~: NOT DETECTED
MDMA (ECSTASY) UR SCREEN: NOT DETECTED
METHADONE SCREEN, URINE: NOT DETECTED
Opiate, Ur Screen: POSITIVE — AB
Phencyclidine (PCP) Ur S: NOT DETECTED
TRICYCLIC, UR SCREEN: NOT DETECTED

## 2018-01-05 MED ORDER — SULFAMETHOXAZOLE-TRIMETHOPRIM 800-160 MG PO TABS: 1.0000 | ORAL_TABLET | Freq: Two times a day (BID) | ORAL | 0 refills | Status: AC

## 2018-01-05 MED ORDER — CHLORHEXIDINE GLUCONATE 4 % EX LIQD: Freq: Every day | CUTANEOUS | 0 refills | Status: DC

## 2018-01-05 MED ORDER — ALPRAZOLAM 1 MG PO TABS: 1.0000 mg | ORAL_TABLET | Freq: Four times a day (QID) | ORAL | Status: DC | PRN

## 2018-01-05 MED ORDER — LORAZEPAM 2 MG/ML IJ SOLN: 0.0000 mg | Freq: Two times a day (BID) | INTRAMUSCULAR | Status: DC

## 2018-01-05 MED ORDER — VITAMIN B-1 100 MG PO TABS: 100.0000 mg | ORAL_TABLET | Freq: Every day | ORAL | Status: DC

## 2018-01-05 MED ORDER — LORAZEPAM 2 MG/ML IJ SOLN
2.0000 mg | Freq: Once | INTRAMUSCULAR | Status: DC
  Filled 2018-01-05: qty 1

## 2018-01-05 MED ORDER — FOLIC ACID 1 MG PO TABS: 1.0000 mg | ORAL_TABLET | Freq: Every day | ORAL | Status: DC

## 2018-01-05 MED ORDER — LORAZEPAM 1 MG PO TABS: 1.0000 mg | ORAL_TABLET | Freq: Four times a day (QID) | ORAL | Status: DC | PRN

## 2018-01-05 MED ORDER — ADULT MULTIVITAMIN W/MINERALS CH: 1.0000 | ORAL_TABLET | Freq: Every day | ORAL | Status: DC

## 2018-01-05 MED ORDER — THIAMINE HCL 100 MG/ML IJ SOLN
100.0000 mg | Freq: Every day | INTRAMUSCULAR | Status: DC
  Filled 2018-01-05: qty 1

## 2018-01-05 MED ORDER — LORAZEPAM 2 MG/ML IJ SOLN: 1.0000 mg | Freq: Four times a day (QID) | INTRAMUSCULAR | Status: DC | PRN

## 2018-01-05 MED ORDER — LORAZEPAM 2 MG/ML IJ SOLN
2.0000 mg | Freq: Once | INTRAMUSCULAR | Status: AC
  Administered 2018-01-05: 2 mg via INTRAVENOUS

## 2018-01-05 MED ORDER — LORAZEPAM 2 MG/ML IJ SOLN: 0.0000 mg | Freq: Four times a day (QID) | INTRAMUSCULAR | Status: DC

## 2018-01-05 NOTE — Progress Notes (Signed)
Discharge instructions given to patient, printed prescriptions given, IV removed. Verbalized understanding of discharge instructions. Patient concerned about her left breast pain and redness. Breast is red and swollen. Notified Dr. Nemiah CommanderKalisetti, Dr. Nemiah CommanderKalisetti assessed patient. Went back to the patient's room to discharge her in a wheelchair, patient had already left the room after being asked to wait for staff. Dr. Nemiah CommanderKalisetti notified of her leaving without staff as well.

## 2018-01-05 NOTE — Progress Notes (Signed)
Patient screaming at this nurse stating she wants to go outside and smoke. Patient requesting xanax 1mg  and the order to be changed to scheduled 4X daily from prn three times daily. Xanax 1mg  given, notified Dr. Nemiah CommanderKalisetti, patient sweating and having tremors. Received one time order for ativan 2mg  and to start CIWA assessments. Offered a nicotene patient for the patient, she refused. Waiting for Dr. Nemiah CommanderKalisetti to assess patient.
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
## 2018-01-05 NOTE — Plan of Care (Signed)

## 2018-06-04 IMAGING — CR DG LUMBAR SPINE COMPLETE 4+V
1 series · 5 of 5 positions shown · non-contrast
Comparison: 03/23/2009 lumbar spine MRI.

CLINICAL DATA: 36 y/o  F; fall 1 week ago with pain.

EXAM:
LUMBAR SPINE - COMPLETE 4+ VIEW

[Series 1: dg lumbar spine complete 4 +v · 0.14mm/px · 5 of 5 slices shown]
[im 1/5]
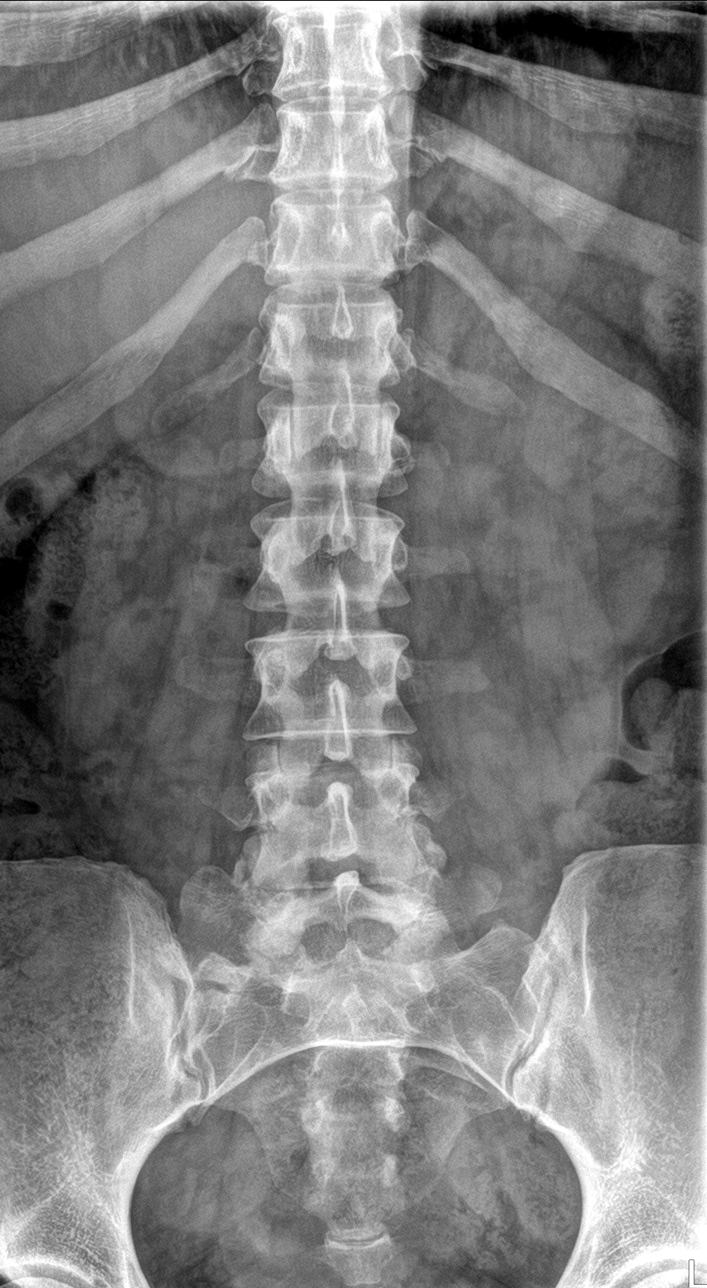
[im 2/5]
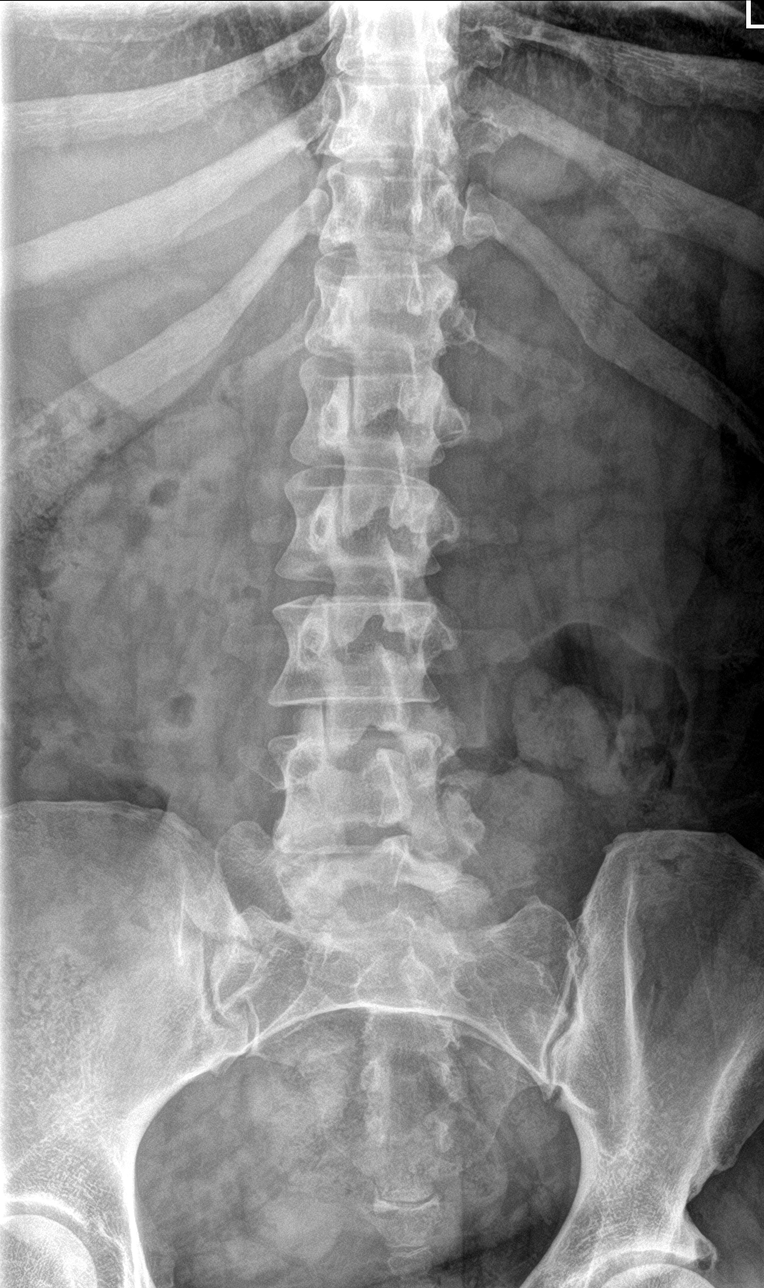
[im 3/5]
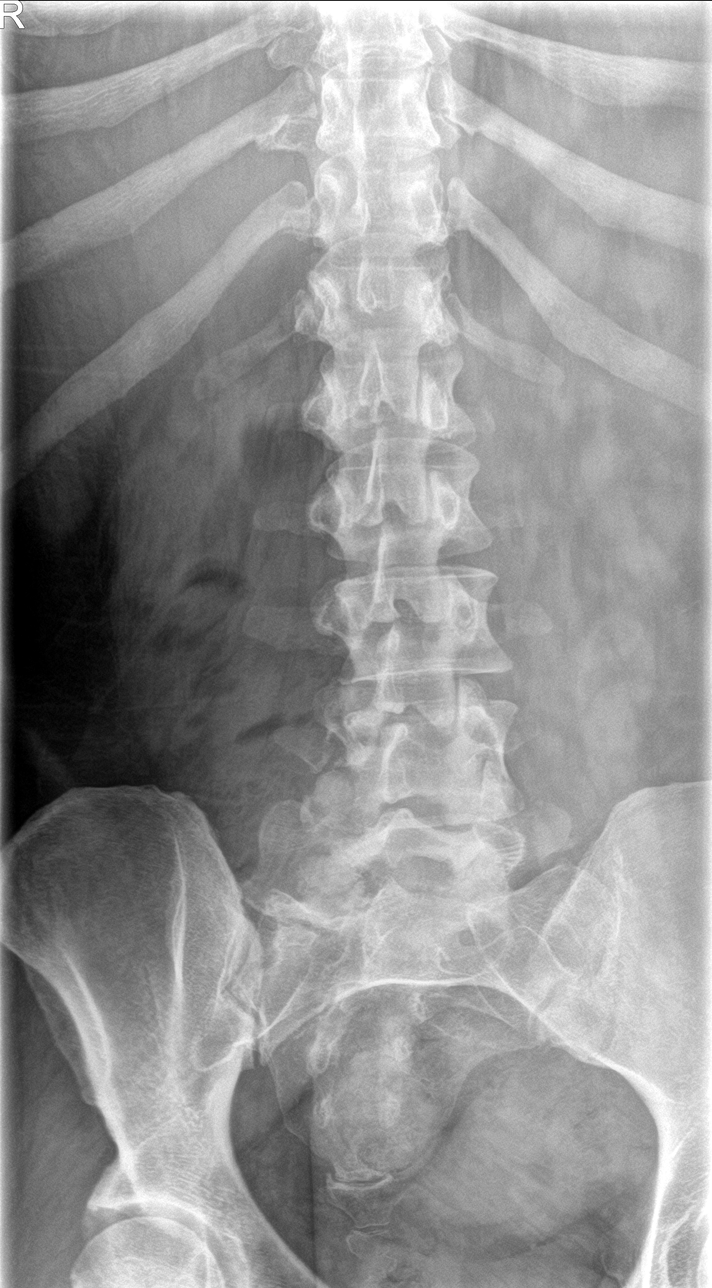
[im 4/5]
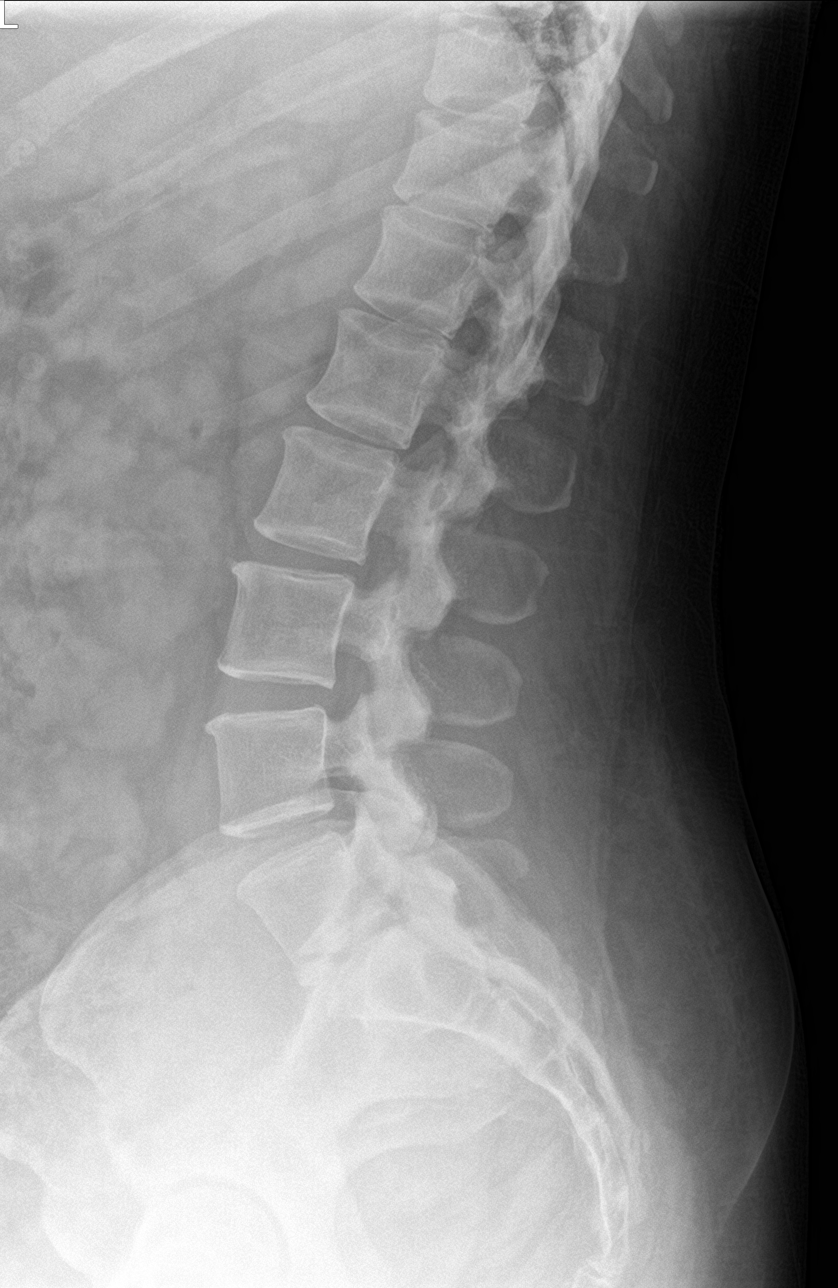
[im 5/5]
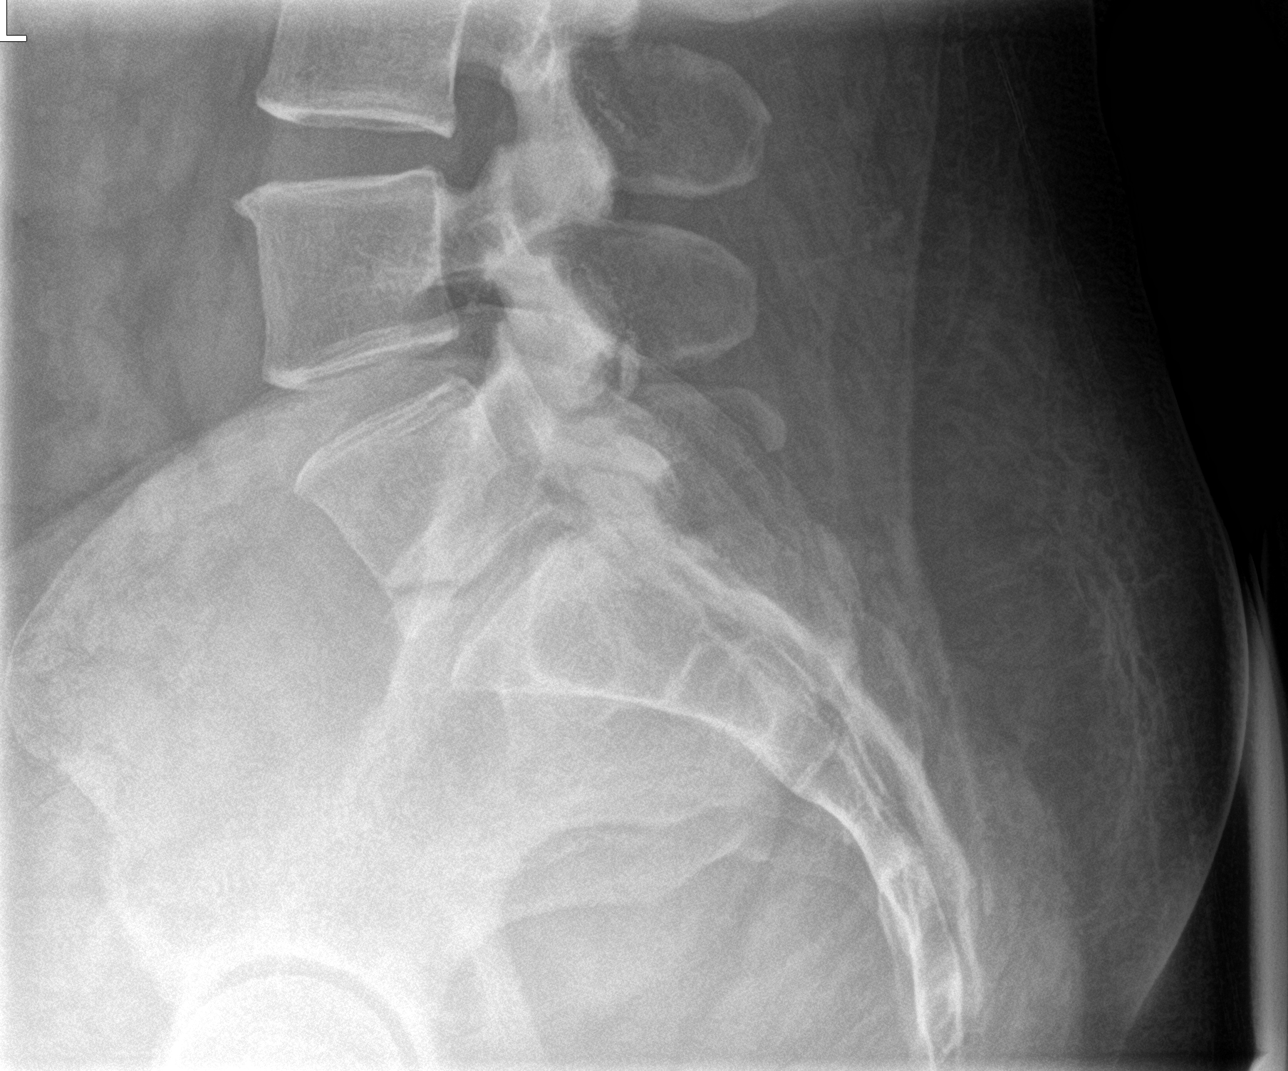

[5 of 5 positions shown; findings below may reference images not displayed]

FINDINGS: Transitional L5 vertebral body with articulation of the right L5
transverse process to sacral alae. Diminutive T12 ribs. Normal
lumbar lordosis without listhesis. Vertebral body and intervertebral
disc space heights are maintained and stable given differences in
technique. Mild lower lumbar facet arthrosis.
IMPRESSION: 1. No acute fracture or malalignment.
2. Minimal lower lumbar spine degenerative changes with facet
arthropathy.

By: Djingis Salamun M.D.

## 2018-10-04 ENCOUNTER — Encounter: Payer: Self-pay | Admitting: Emergency Medicine

## 2018-10-04 ENCOUNTER — Other Ambulatory Visit: Payer: Self-pay

## 2018-10-04 ENCOUNTER — Emergency Department
Admission: EM | Admit: 2018-10-04 | Discharge: 2018-10-04 | Disposition: A | Discharge status: Home / Self Care | Payer: Medicaid Other | Attending: Student in an Organized Health Care Education/Training Program | Admitting: Student in an Organized Health Care Education/Training Program

## 2018-10-04 DIAGNOSIS — F411 Generalized anxiety disorder: Secondary | ICD-10-CM | POA: Diagnosis present

## 2018-10-04 DIAGNOSIS — F418 Other specified anxiety disorders: Secondary | ICD-10-CM | POA: Diagnosis not present

## 2018-10-04 DIAGNOSIS — Z20828 Contact with and (suspected) exposure to other viral communicable diseases: Secondary | ICD-10-CM | POA: Insufficient documentation

## 2018-10-04 DIAGNOSIS — Z79899 Other long term (current) drug therapy: Secondary | ICD-10-CM | POA: Diagnosis not present

## 2018-10-04 DIAGNOSIS — F17210 Nicotine dependence, cigarettes, uncomplicated: Secondary | ICD-10-CM | POA: Insufficient documentation

## 2018-10-04 DIAGNOSIS — E785 Hyperlipidemia, unspecified: Secondary | ICD-10-CM | POA: Diagnosis not present

## 2018-10-04 DIAGNOSIS — F419 Anxiety disorder, unspecified: Secondary | ICD-10-CM | POA: Diagnosis present

## 2018-10-04 DIAGNOSIS — F22 Delusional disorders: Secondary | ICD-10-CM | POA: Diagnosis present

## 2018-10-04 LAB — URINE DRUG SCREEN, QUALITATIVE (ARMC ONLY)
Amphetamines, Ur Screen: POSITIVE — AB
Barbiturates, Ur Screen: NOT DETECTED
Benzodiazepine, Ur Scrn: NOT DETECTED
Cannabinoid 50 Ng, Ur ~~LOC~~: NOT DETECTED
Cocaine Metabolite,Ur ~~LOC~~: NOT DETECTED
MDMA (Ecstasy)Ur Screen: NOT DETECTED
Methadone Scn, Ur: NOT DETECTED
Opiate, Ur Screen: NOT DETECTED
Phencyclidine (PCP) Ur S: NOT DETECTED
Tricyclic, Ur Screen: NOT DETECTED

## 2018-10-04 LAB — COMPREHENSIVE METABOLIC PANEL
ALT: 16 U/L (ref 0–44)
AST: 18 U/L (ref 15–41)
Albumin: 4.6 g/dL (ref 3.5–5.0)
Alkaline Phosphatase: 98 U/L (ref 38–126)
Anion gap: 10 (ref 5–15)
BUN: 10 mg/dL (ref 6–20)
CO2: 21 mmol/L — ABNORMAL LOW (ref 22–32)
Calcium: 9.1 mg/dL (ref 8.9–10.3)
Chloride: 104 mmol/L (ref 98–111)
Creatinine, Ser: 0.98 mg/dL (ref 0.44–1.00)
GFR calc Af Amer: >60 mL/min (ref >60)
GFR calc non Af Amer: >60 mL/min (ref >60)
Glucose, Bld: 143 mg/dL — ABNORMAL HIGH (ref 70–99)
Potassium: 3.5 mmol/L (ref 3.5–5.1)
Sodium: 135 mmol/L (ref 135–145)
Total Bilirubin: 0.5 mg/dL (ref 0.3–1.2)
Total Protein: 8.4 g/dL — ABNORMAL HIGH (ref 6.5–8.1)

## 2018-10-04 LAB — URINALYSIS, COMPLETE (UACMP) WITH MICROSCOPIC
Bilirubin Urine: NEGATIVE
Glucose, UA: NEGATIVE mg/dL
Hgb urine dipstick: NEGATIVE
Ketones, ur: NEGATIVE mg/dL
Nitrite: NEGATIVE
Protein, ur: 100 mg/dL — AB
Specific Gravity, Urine: 1.018 (ref 1.005–1.030)
pH: 5 (ref 5.0–8.0)

## 2018-10-04 LAB — CBC
HCT: 43.5 % (ref 36.0–46.0)
Hemoglobin: 14.9 g/dL (ref 12.0–15.0)
MCH: 27.8 pg (ref 26.0–34.0)
MCHC: 34.3 g/dL (ref 30.0–36.0)
MCV: 81.2 fL (ref 80.0–100.0)
Platelets: 328 10*3/uL (ref 150–400)
RBC: 5.36 MIL/uL — ABNORMAL HIGH (ref 3.87–5.11)
RDW: 13 % (ref 11.5–15.5)
WBC: 12.3 10*3/uL — ABNORMAL HIGH (ref 4.0–10.5)
nRBC: 0 % (ref 0.0–0.2)

## 2018-10-04 LAB — SARS CORONAVIRUS 2 BY RT PCR (HOSPITAL ORDER, PERFORMED IN ~~LOC~~ HOSPITAL LAB): SARS Coronavirus 2: NEGATIVE

## 2018-10-04 MED ORDER — ALPRAZOLAM 0.5 MG PO TABS: 1.0000 mg | ORAL_TABLET | Freq: Once | ORAL | Status: DC

## 2018-10-04 MED ORDER — CEPHALEXIN 500 MG PO CAPS
500.0000 mg | ORAL_CAPSULE | Freq: Three times a day (TID) | ORAL | Status: DC
  Filled 2018-10-04 (×2): qty 1

## 2018-10-04 MED ORDER — CEPHALEXIN 500 MG PO CAPS: 500.0000 mg | ORAL_CAPSULE | Freq: Three times a day (TID) | ORAL | 0 refills | Status: DC

## 2018-10-04 MED ORDER — SODIUM CHLORIDE 0.9 % IV BOLUS: 1000.0000 mL | Freq: Once | INTRAVENOUS | Status: DC

## 2018-10-04 NOTE — ED Notes (Signed)
EDP at bedside at this time, speaking with patient regarding IVC.
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
## 2018-10-04 NOTE — ED Notes (Signed)
Pt discharged home. Pt denies SI/HI and pain. All belongings returned to patient. Discharge paperwork and prescription reviewed with patient. Patient signed for discharge.
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
## 2018-10-04 NOTE — ED Notes (Signed)
Pt dressed out by this RN and Larene Beach, RN. Pt's belongings placed in a belongings bag and taken to [facility], belongings include: Black sandals, camels cigarettes, lighter, [employer]ia car key with blue cord, cell phone,  black shirt, black pants, black sunglasses, and black hair tie.
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
## 2018-10-04 NOTE — ED Notes (Signed)
Patient was
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
## 2018-10-04 NOTE — ED Notes (Signed)
Pt will be discharged home.

## 2018-10-04 NOTE — ED Notes (Signed)
IVC/Consult pending/Moved to 20 Hallway

## 2018-10-04 NOTE — Discharge Instructions (Signed)

## 2018-10-05 LAB — URINE CULTURE

## 2018-11-02 ENCOUNTER — Emergency Department
Admission: EM | Admit: 2018-11-02 | Discharge: 2018-11-02 | Discharge status: Against Medical Advice | Payer: Medicaid Other | Attending: Emergency Medicine | Admitting: Emergency Medicine

## 2018-11-02 ENCOUNTER — Other Ambulatory Visit: Payer: Self-pay

## 2018-11-02 DIAGNOSIS — F411 Generalized anxiety disorder: Secondary | ICD-10-CM | POA: Diagnosis not present

## 2018-11-02 DIAGNOSIS — R002 Palpitations: Secondary | ICD-10-CM | POA: Diagnosis not present

## 2018-11-02 DIAGNOSIS — Z036 Encounter for observation for suspected toxic effect from ingested substance ruled out: Secondary | ICD-10-CM | POA: Diagnosis present

## 2018-11-02 DIAGNOSIS — F17210 Nicotine dependence, cigarettes, uncomplicated: Secondary | ICD-10-CM | POA: Diagnosis not present

## 2018-11-02 DIAGNOSIS — R5383 Other fatigue: Secondary | ICD-10-CM | POA: Diagnosis not present

## 2018-11-02 DIAGNOSIS — R5381 Other malaise: Secondary | ICD-10-CM

## 2018-11-02 LAB — CBC WITH DIFFERENTIAL/PLATELET
Abs Immature Granulocytes: 0.04 10*3/uL (ref 0.00–0.07)
Basophils Absolute: 0.1 10*3/uL (ref 0.0–0.1)
Basophils Relative: 0 %
Eosinophils Absolute: 0.3 10*3/uL (ref 0.0–0.5)
Eosinophils Relative: 2 %
HCT: 42.6 % (ref 36.0–46.0)
Hemoglobin: 14.6 g/dL (ref 12.0–15.0)
Immature Granulocytes: 0 %
Lymphocytes Relative: 24 %
Lymphs Abs: 2.9 10*3/uL (ref 0.7–4.0)
MCH: 28.1 pg (ref 26.0–34.0)
MCHC: 34.3 g/dL (ref 30.0–36.0)
MCV: 82.1 fL (ref 80.0–100.0)
Monocytes Absolute: 0.8 10*3/uL (ref 0.1–1.0)
Monocytes Relative: 7 %
Neutro Abs: 7.9 10*3/uL — ABNORMAL HIGH (ref 1.7–7.7)
Neutrophils Relative %: 67 %
Platelets: 329 10*3/uL (ref 150–400)
RBC: 5.19 MIL/uL — ABNORMAL HIGH (ref 3.87–5.11)
RDW: 13.5 % (ref 11.5–15.5)
WBC: 12 10*3/uL — ABNORMAL HIGH (ref 4.0–10.5)
nRBC: 0 % (ref 0.0–0.2)

## 2018-11-02 MED ORDER — LACTATED RINGERS IV BOLUS: 1000.0000 mL | Freq: Once | INTRAVENOUS | Status: DC
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
## 2018-11-02 NOTE — ED Notes (Signed)
ED Provider at bedside. 

## 2018-11-02 NOTE — ED Notes (Signed)
Patient out in hallway dressed.  Patient states "I'm just going to go, it was just a panic attack and I'm feeling better."  MD notified.
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
## 2019-08-02 ENCOUNTER — Emergency Department
Admission: EM | Admit: 2019-08-02 | Discharge: 2019-08-02 | Disposition: A | Discharge status: Home / Self Care | Payer: Medicaid Other | Attending: Emergency Medicine | Admitting: Emergency Medicine

## 2019-08-02 ENCOUNTER — Encounter: Payer: Self-pay | Admitting: Intensive Care

## 2019-08-02 ENCOUNTER — Other Ambulatory Visit: Payer: Self-pay

## 2019-08-02 DIAGNOSIS — F17210 Nicotine dependence, cigarettes, uncomplicated: Secondary | ICD-10-CM | POA: Insufficient documentation

## 2019-08-02 DIAGNOSIS — Z888 Allergy status to other drugs, medicaments and biological substances status: Secondary | ICD-10-CM | POA: Insufficient documentation

## 2019-08-02 DIAGNOSIS — L03115 Cellulitis of right lower limb: Secondary | ICD-10-CM

## 2019-08-02 DIAGNOSIS — Z4800 Encounter for change or removal of nonsurgical wound dressing: Secondary | ICD-10-CM | POA: Diagnosis not present

## 2019-08-02 DIAGNOSIS — L539 Erythematous condition, unspecified: Secondary | ICD-10-CM | POA: Diagnosis not present

## 2019-08-02 DIAGNOSIS — L089 Local infection of the skin and subcutaneous tissue, unspecified: Secondary | ICD-10-CM | POA: Insufficient documentation

## 2019-08-02 LAB — COMPREHENSIVE METABOLIC PANEL
ALT: 14 U/L (ref 0–44)
AST: 15 U/L (ref 15–41)
Albumin: 4.2 g/dL (ref 3.5–5.0)
Alkaline Phosphatase: 89 U/L (ref 38–126)
Anion gap: 11 (ref 5–15)
BUN: 11 mg/dL (ref 6–20)
CO2: 18 mmol/L — ABNORMAL LOW (ref 22–32)
Calcium: 8.7 mg/dL — ABNORMAL LOW (ref 8.9–10.3)
Chloride: 108 mmol/L (ref 98–111)
Creatinine, Ser: 0.8 mg/dL (ref 0.44–1.00)
GFR calc Af Amer: >60 mL/min (ref >60)
GFR calc non Af Amer: >60 mL/min (ref >60)
Glucose, Bld: 166 mg/dL — ABNORMAL HIGH (ref 70–99)
Potassium: 4 mmol/L (ref 3.5–5.1)
Sodium: 137 mmol/L (ref 135–145)
Total Bilirubin: 0.5 mg/dL (ref 0.3–1.2)
Total Protein: 7.6 g/dL (ref 6.5–8.1)

## 2019-08-02 LAB — CBC WITH DIFFERENTIAL/PLATELET
Abs Immature Granulocytes: 0.05 10*3/uL (ref 0.00–0.07)
Basophils Absolute: 0 10*3/uL (ref 0.0–0.1)
Basophils Relative: 0 %
Eosinophils Absolute: 0.1 10*3/uL (ref 0.0–0.5)
Eosinophils Relative: 1 %
HCT: 41.6 % (ref 36.0–46.0)
Hemoglobin: 14.3 g/dL (ref 12.0–15.0)
Immature Granulocytes: 1 %
Lymphocytes Relative: 20 %
Lymphs Abs: 2.1 10*3/uL (ref 0.7–4.0)
MCH: 28.4 pg (ref 26.0–34.0)
MCHC: 34.4 g/dL (ref 30.0–36.0)
MCV: 82.7 fL (ref 80.0–100.0)
Monocytes Absolute: 0.7 10*3/uL (ref 0.1–1.0)
Monocytes Relative: 6 %
Neutro Abs: 7.6 10*3/uL (ref 1.7–7.7)
Neutrophils Relative %: 72 %
Platelets: 260 10*3/uL (ref 150–400)
RBC: 5.03 MIL/uL (ref 3.87–5.11)
RDW: 12.9 % (ref 11.5–15.5)
WBC: 10.5 10*3/uL (ref 4.0–10.5)
nRBC: 0 % (ref 0.0–0.2)

## 2019-08-02 LAB — LACTIC ACID, PLASMA: Lactic Acid, Venous: 1.4 mmol/L (ref 0.5–1.9)

## 2019-08-02 MED ORDER — NAPROXEN 500 MG PO TABS: 500.0000 mg | ORAL_TABLET | Freq: Two times a day (BID) | ORAL | 0 refills | Status: DC

## 2019-08-02 MED ORDER — ONDANSETRON 4 MG PO TBDP: 4.0000 mg | ORAL_TABLET | Freq: Three times a day (TID) | ORAL | 0 refills | Status: DC | PRN

## 2019-08-02 MED ORDER — CEPHALEXIN 500 MG PO CAPS: 500.0000 mg | ORAL_CAPSULE | Freq: Three times a day (TID) | ORAL | 0 refills | Status: DC

## 2019-08-02 NOTE — ED Notes (Signed)
Pt stated she has hx of MRSA and staph infections and believes she might need "stronger, IV antibiotics"
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
## 2019-08-02 NOTE — Discharge Instructions (Signed)
Keep taking the Bactrim that was already prescribed to you, and add on Keflex to treat the skin infection on your right thigh.  Continue using warm compresses to help speed healing.

## 2019-08-02 NOTE — ED Triage Notes (Signed)
Patient has wound present on right groin area. Redness all around wound and open areas black in color. Drainage also noted. Denies fevers. Hx MRSA infection

## 2021-05-07 DIAGNOSIS — B851 Pediculosis due to Pediculus humanus corporis: Secondary | ICD-10-CM

## 2021-05-07 HISTORY — DX: Pediculosis due to pediculus humanus corporis: B85.1

## 2021-05-20 ENCOUNTER — Other Ambulatory Visit: Payer: Self-pay

## 2021-05-20 ENCOUNTER — Emergency Department
Admission: EM | Admit: 2021-05-20 | Discharge: 2021-05-20 | Disposition: A | Discharge status: Home / Self Care | Payer: Medicaid Other | Attending: Emergency Medicine | Admitting: Emergency Medicine

## 2021-05-20 ENCOUNTER — Encounter: Payer: Self-pay | Admitting: Intensive Care

## 2021-05-20 DIAGNOSIS — B851 Pediculosis due to Pediculus humanus corporis: Secondary | ICD-10-CM

## 2021-05-20 LAB — CBC WITH DIFFERENTIAL/PLATELET
Abs Immature Granulocytes: 0.02 10*3/uL (ref 0.00–0.07)
Basophils Absolute: 0 10*3/uL (ref 0.0–0.1)
Basophils Relative: 1 %
Eosinophils Absolute: 0.1 10*3/uL (ref 0.0–0.5)
Eosinophils Relative: 1 %
HCT: 39.9 % (ref 36.0–46.0)
Hemoglobin: 12.9 g/dL (ref 12.0–15.0)
Immature Granulocytes: 0 %
Lymphocytes Relative: 31 %
Lymphs Abs: 2.5 10*3/uL (ref 0.7–4.0)
MCH: 27 pg (ref 26.0–34.0)
MCHC: 32.3 g/dL (ref 30.0–36.0)
MCV: 83.5 fL (ref 80.0–100.0)
Monocytes Absolute: 0.6 10*3/uL (ref 0.1–1.0)
Monocytes Relative: 7 %
Neutro Abs: 4.9 10*3/uL (ref 1.7–7.7)
Neutrophils Relative %: 60 %
Platelets: 225 10*3/uL (ref 150–400)
RBC: 4.78 MIL/uL (ref 3.87–5.11)
RDW: 13.4 % (ref 11.5–15.5)
WBC: 8 10*3/uL (ref 4.0–10.5)
nRBC: 0 % (ref 0.0–0.2)

## 2021-05-20 LAB — COMPREHENSIVE METABOLIC PANEL
ALT: 23 U/L (ref 0–44)
AST: 19 U/L (ref 15–41)
Albumin: 4.1 g/dL (ref 3.5–5.0)
Alkaline Phosphatase: 66 U/L (ref 38–126)
Anion gap: 7 (ref 5–15)
BUN: 14 mg/dL (ref 6–20)
CO2: 26 mmol/L (ref 22–32)
Calcium: 9.2 mg/dL (ref 8.9–10.3)
Chloride: 106 mmol/L (ref 98–111)
Creatinine, Ser: 0.83 mg/dL (ref 0.44–1.00)
GFR, Estimated: >60 mL/min (ref >60)
Glucose, Bld: 120 mg/dL — ABNORMAL HIGH (ref 70–99)
Potassium: 3.7 mmol/L (ref 3.5–5.1)
Sodium: 139 mmol/L (ref 135–145)
Total Bilirubin: 0.5 mg/dL (ref 0.3–1.2)
Total Protein: 7.5 g/dL (ref 6.5–8.1)

## 2021-05-20 MED ORDER — IVERMECTIN 3 MG PO TABS: 200.0000 ug/kg | ORAL_TABLET | Freq: Once | ORAL | 0 refills | Status: AC

## 2021-05-20 NOTE — ED Provider Notes (Signed)
?[facility] EMERGENCY DEPARTMENT ?Provider Note ? ? ?CSN: [id number] ?Arrival date & time: 05/20/21  1541 ? ?  ? ?History ? ?No chief complaint on file. ? ? ?Chelsea Hayes is a 41 y.o. female presents to the emergency department for evaluation of lice.  She states a couple weeks ago she was infested with lice, she was living in an environment with lots of lice and treated herself with permethrin cream x2 over the last couple of weeks.  She still feels as if there are bugs crawling all over her.  She has had some skin irritation from permethrin x2.  She mentions a fever that she developed a while back but has been currently afebrile. ? ?HPI ? ?  ? ?Home Medications ?Prior to Admission medications   ?Medication Sig Start Date End Date Taking? Authorizing Provider  ?ivermectin (STROMECTOL) 3 MG TABS tablet Take 5 tablets (15,000 mcg total) by mouth once for 1 dose. Weekly x 2 doses 05/20/21 05/20/21 Yes Evon Slack, PA-C  ?   ? ?Allergies    ?Lunesta [eszopiclone]   ? ?Review of Systems   ?Review of Systems ? ?Physical Exam ?Updated Vital Signs ?BP (!) 147/85 (BP Location: Left Arm)   Pulse 93   Temp 98.8 ?F (37.1 ?C) (Oral)   Resp 18   Ht 5\' 3"  (1.6 m)   Wt 77.1 kg   LMP  (LMP Unknown)   SpO2 96%   BMI 30.11 kg/m?  ?Physical Exam ?Constitutional:   ?   Appearance: She is well-developed.  ?HENT:  ?   Head: Normocephalic and atraumatic.  ?Eyes:  ?   Conjunctiva/sclera: Conjunctivae normal.  ?Cardiovascular:  ?   Rate and Rhythm: Normal rate.  ?Pulmonary:  ?   Effort: Pulmonary effort is normal. No respiratory distress.  ?Musculoskeletal:     ?   General: Normal range of motion.  ?   Cervical back: Normal range of motion.  ?Skin: ?   General: Skin is warm.  ?   Findings: No rash.  ?   Comments: No visible lice present.  Patient evaluated with a [place] lamp from the head neck face arms and legs, no presence of visible lice.  No maculopapular rashes.  No abscess formation.   ?Neurological:  ?   Mental Status: She is alert and oriented to person, place, and time.  ?Psychiatric:     ?   Behavior: Behavior normal.     ?   Thought Content: Thought content normal.  ? ? ?ED Results / Procedures / Treatments   ?Labs ?(all labs ordered are listed, but only abnormal results are displayed) ?Labs Reviewed  ?COMPREHENSIVE METABOLIC PANEL - Abnormal; Notable for the following components:  ?    Result Value  ? Glucose, Bld 120 (*)   ? All other components within normal limits  ?CBC WITH DIFFERENTIAL/PLATELET  ? ? ?EKG ?None ? ?Radiology ?No results found. ? ?Procedures ?Procedures  ? ? ?Medications Ordered in ED ?Medications - No data to display ? ?ED Course/ Medical Decision Making/ A&P ?  ?                        ?Medical Decision Making ?Amount and/or Complexity of Data Reviewed ?Labs: ordered. ? ?Risk ?Prescription drug management. ? ? ?41 year old female with lice x2 weeks.  She treated herself with permethrin x2 but continues to have not only skin irritation but sensation of something crawling all over her.  She feels as if there is continued lice present on her body, face and head.  Patient with normal vital signs, normal blood work.  Discussed treatment options and patient is wanting to continue with treatment, she is agreeable to p.o. ivermectin, hopefully this will help with residual lice and not cause further skin irritation.  I think there is a good chance that a lot of her skin irritation and sensation of something crawling on her is adverse reaction from permethrin treatment x2 ?Final Clinical Impression(s) / ED Diagnoses ?Final diagnoses:  ?Body lice  ? ? ?Rx / DC Orders ?ED Discharge Orders   ? ?      Ordered  ?  ivermectin (STROMECTOL) 3 MG TABS tablet   Once       ? 05/20/21 1722  ? ?  ?  ? ?  ? ? ?  ?Ronnette Juniper ?05/20/21 1855 ? ?  ?Chesley Noon, MD ?05/24/21 1611 ? ?

## 2021-05-20 NOTE — ED Triage Notes (Signed)
Patient reports getting treated for lice recently and reports she feels like she might still have it. She states "I feel them" ?

## 2021-05-20 NOTE — Discharge Instructions (Signed)
Please take oral ivermectin as prescribed.  Return to the ER for any fevers, nausea, vomiting, worsening symptoms or to changes in your health. ?

## 2021-05-24 ENCOUNTER — Encounter: Payer: Self-pay | Admitting: Emergency Medicine

## 2021-05-24 ENCOUNTER — Ambulatory Visit
Admission: EM | Admit: 2021-05-24 | Discharge: 2021-05-24 | Disposition: A | Discharge status: Home / Self Care | Payer: Medicaid Other

## 2021-05-24 DIAGNOSIS — L299 Pruritus, unspecified: Secondary | ICD-10-CM

## 2021-05-24 DIAGNOSIS — Z8619 Personal history of other infectious and parasitic diseases: Secondary | ICD-10-CM

## 2021-05-24 DIAGNOSIS — R569 Unspecified convulsions: Secondary | ICD-10-CM | POA: Insufficient documentation

## 2021-05-24 HISTORY — DX: Opioid use, unspecified with withdrawal: F11.93

## 2021-05-24 MED ORDER — PERMETHRIN 5 % EX CREA: TOPICAL_CREAM | CUTANEOUS | 0 refills | Status: DC

## 2021-05-24 NOTE — Discharge Instructions (Addendum)
Use the Elimite cream as directed.  Follow-up with a dermatologist if your symptoms persist. ?
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
## 2021-05-24 NOTE — ED Triage Notes (Signed)
Pt states she has been treated for lice recently and can still feel them in her hair. Would like a topical medication to help. ?

## 2021-05-26 ENCOUNTER — Telehealth: Payer: Self-pay | Admitting: Family Medicine

## 2021-05-26 MED ORDER — PERMETHRIN 5 % EX CREA: TOPICAL_CREAM | CUTANEOUS | 0 refills | Status: DC

## 2021-05-26 NOTE — Telephone Encounter (Signed)
Refilled permethrin as patient reported she did not have enough to fully apply to her hair into her body. ?No additional refills will be given as patient has been treated multiple times for body lice in hair lice. ?

## 2021-05-28 ENCOUNTER — Emergency Department
Admission: EM | Admit: 2021-05-28 | Discharge: 2021-05-28 | Disposition: A | Discharge status: Home / Self Care | Payer: Medicaid Other | Attending: Emergency Medicine | Admitting: Emergency Medicine

## 2021-05-28 ENCOUNTER — Other Ambulatory Visit: Payer: Self-pay

## 2021-05-28 ENCOUNTER — Emergency Department: Payer: Medicaid Other

## 2021-05-28 DIAGNOSIS — R002 Palpitations: Secondary | ICD-10-CM | POA: Diagnosis not present

## 2021-05-28 DIAGNOSIS — R0602 Shortness of breath: Secondary | ICD-10-CM | POA: Diagnosis present

## 2021-05-28 LAB — CBC
HCT: 41.6 % (ref 36.0–46.0)
Hemoglobin: 13.7 g/dL (ref 12.0–15.0)
MCH: 27.2 pg (ref 26.0–34.0)
MCHC: 32.9 g/dL (ref 30.0–36.0)
MCV: 82.5 fL (ref 80.0–100.0)
Platelets: 246 10*3/uL (ref 150–400)
RBC: 5.04 MIL/uL (ref 3.87–5.11)
RDW: 13.5 % (ref 11.5–15.5)
WBC: 7.9 10*3/uL (ref 4.0–10.5)
nRBC: 0 % (ref 0.0–0.2)

## 2021-05-28 LAB — BASIC METABOLIC PANEL
Anion gap: 8 (ref 5–15)
BUN: 14 mg/dL (ref 6–20)
CO2: 20 mmol/L — ABNORMAL LOW (ref 22–32)
Calcium: 9 mg/dL (ref 8.9–10.3)
Chloride: 107 mmol/L (ref 98–111)
Creatinine, Ser: 0.64 mg/dL (ref 0.44–1.00)
GFR, Estimated: >60 mL/min (ref >60)
Glucose, Bld: 91 mg/dL (ref 70–99)
Potassium: 3.6 mmol/L (ref 3.5–5.1)
Sodium: 135 mmol/L (ref 135–145)

## 2021-05-28 LAB — TSH: TSH: 2.792 u[IU]/mL (ref 0.350–4.500)

## 2021-05-28 LAB — TROPONIN I (HIGH SENSITIVITY): Troponin I (High Sensitivity): <2 ng/L (ref <18)
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
## 2021-05-28 NOTE — ED Notes (Signed)
Dc ppw provided. PT denies any questions at this time. Follow up reviewed. Pt provides verbal consent for dc and ambulatory off unit alert and oriented x4 ?

## 2021-05-28 NOTE — Discharge Instructions (Signed)
Your exam, labs, chest x-ray, and EKG are all normal and reassuring at this time.  No signs of any serious underlying cardiac or pulmonary issues.  Your thyroid hormone is also normal at this time.  You should follow-up with primary provider for ongoing symptoms.  Return to the ED if necessary. ?

## 2021-05-28 NOTE — ED Triage Notes (Signed)
Pt comes with c/o SOB that started with heart palpitations this am. Pt states she feel like she isn't getting enough oxygen. Pt denies any CP. ?

## 2021-05-30 ENCOUNTER — Other Ambulatory Visit: Payer: Self-pay

## 2021-05-30 ENCOUNTER — Observation Stay
Admission: EM | Admit: 2021-05-30 | Discharge: 2021-05-31 | Disposition: A | Discharge status: Home / Self Care | Payer: Medicaid Other | Attending: Internal Medicine | Admitting: Internal Medicine

## 2021-05-30 ENCOUNTER — Emergency Department: Payer: Medicaid Other

## 2021-05-30 ENCOUNTER — Encounter: Payer: Self-pay | Admitting: Emergency Medicine

## 2021-05-30 DIAGNOSIS — F909 Attention-deficit hyperactivity disorder, unspecified type: Secondary | ICD-10-CM | POA: Diagnosis not present

## 2021-05-30 DIAGNOSIS — R0602 Shortness of breath: Secondary | ICD-10-CM | POA: Diagnosis not present

## 2021-05-30 DIAGNOSIS — Z79899 Other long term (current) drug therapy: Secondary | ICD-10-CM | POA: Diagnosis not present

## 2021-05-30 DIAGNOSIS — R002 Palpitations: Secondary | ICD-10-CM

## 2021-05-30 DIAGNOSIS — Z8543 Personal history of malignant neoplasm of ovary: Secondary | ICD-10-CM | POA: Insufficient documentation

## 2021-05-30 DIAGNOSIS — Z758 Other problems related to medical facilities and other health care: Secondary | ICD-10-CM | POA: Diagnosis present

## 2021-05-30 DIAGNOSIS — I10 Essential (primary) hypertension: Secondary | ICD-10-CM | POA: Insufficient documentation

## 2021-05-30 DIAGNOSIS — Z720 Tobacco use: Secondary | ICD-10-CM | POA: Diagnosis present

## 2021-05-30 DIAGNOSIS — Z87891 Personal history of nicotine dependence: Secondary | ICD-10-CM | POA: Insufficient documentation

## 2021-05-30 DIAGNOSIS — R778 Other specified abnormalities of plasma proteins: Secondary | ICD-10-CM | POA: Insufficient documentation

## 2021-05-30 DIAGNOSIS — F411 Generalized anxiety disorder: Secondary | ICD-10-CM | POA: Diagnosis present

## 2021-05-30 DIAGNOSIS — E876 Hypokalemia: Secondary | ICD-10-CM | POA: Diagnosis not present

## 2021-05-30 DIAGNOSIS — R569 Unspecified convulsions: Secondary | ICD-10-CM

## 2021-05-30 DIAGNOSIS — E785 Hyperlipidemia, unspecified: Secondary | ICD-10-CM | POA: Diagnosis present

## 2021-05-30 LAB — COMPREHENSIVE METABOLIC PANEL
ALT: 14 U/L (ref 0–44)
AST: 14 U/L — ABNORMAL LOW (ref 15–41)
Albumin: 3.9 g/dL (ref 3.5–5.0)
Alkaline Phosphatase: 46 U/L (ref 38–126)
Anion gap: 7 (ref 5–15)
BUN: 11 mg/dL (ref 6–20)
CO2: 22 mmol/L (ref 22–32)
Calcium: 8.7 mg/dL — ABNORMAL LOW (ref 8.9–10.3)
Chloride: 108 mmol/L (ref 98–111)
Creatinine, Ser: 0.71 mg/dL (ref 0.44–1.00)
GFR, Estimated: >60 mL/min (ref >60)
Glucose, Bld: 92 mg/dL (ref 70–99)
Potassium: 3.4 mmol/L — ABNORMAL LOW (ref 3.5–5.1)
Sodium: 137 mmol/L (ref 135–145)
Total Bilirubin: 0.6 mg/dL (ref 0.3–1.2)
Total Protein: 7 g/dL (ref 6.5–8.1)

## 2021-05-30 LAB — TROPONIN I (HIGH SENSITIVITY)
Troponin I (High Sensitivity): 20 ng/L — ABNORMAL HIGH (ref <18)
Troponin I (High Sensitivity): 19 ng/L — ABNORMAL HIGH (ref <18)

## 2021-05-30 LAB — CBC WITH DIFFERENTIAL/PLATELET
Abs Immature Granulocytes: 0.03 10*3/uL (ref 0.00–0.07)
Basophils Absolute: 0 10*3/uL (ref 0.0–0.1)
Basophils Relative: 0 %
Eosinophils Absolute: 0 10*3/uL (ref 0.0–0.5)
Eosinophils Relative: 0 %
HCT: 43.3 % (ref 36.0–46.0)
Hemoglobin: 14.2 g/dL (ref 12.0–15.0)
Immature Granulocytes: 0 %
Lymphocytes Relative: 25 %
Lymphs Abs: 2.6 10*3/uL (ref 0.7–4.0)
MCH: 27 pg (ref 26.0–34.0)
MCHC: 32.8 g/dL (ref 30.0–36.0)
MCV: 82.5 fL (ref 80.0–100.0)
Monocytes Absolute: 0.6 10*3/uL (ref 0.1–1.0)
Monocytes Relative: 6 %
Neutro Abs: 7.1 10*3/uL (ref 1.7–7.7)
Neutrophils Relative %: 69 %
Platelets: 246 10*3/uL (ref 150–400)
RBC: 5.25 MIL/uL — ABNORMAL HIGH (ref 3.87–5.11)
RDW: 13.5 % (ref 11.5–15.5)
WBC: 10.4 10*3/uL (ref 4.0–10.5)
nRBC: 0 % (ref 0.0–0.2)

## 2021-05-30 LAB — BRAIN NATRIURETIC PEPTIDE: B Natriuretic Peptide: 15.7 pg/mL (ref 0.0–100.0)

## 2021-05-30 LAB — PREGNANCY, URINE: Preg Test, Ur: NEGATIVE

## 2021-05-30 LAB — D-DIMER, QUANTITATIVE: D-Dimer, Quant: 0.58 ug/mL-FEU — ABNORMAL HIGH (ref 0.00–0.50)

## 2021-05-30 MED ORDER — ACETAMINOPHEN 325 MG PO TABS: 650.0000 mg | ORAL_TABLET | Freq: Four times a day (QID) | ORAL | Status: DC | PRN

## 2021-05-30 MED ORDER — IOHEXOL 350 MG/ML SOLN
50.0000 mL | Freq: Once | INTRAVENOUS | Status: AC | PRN
  Administered 2021-05-30: 50 mL via INTRAVENOUS

## 2021-05-30 MED ORDER — ONDANSETRON HCL 4 MG/2ML IJ SOLN: 4.0000 mg | Freq: Four times a day (QID) | INTRAMUSCULAR | Status: DC | PRN

## 2021-05-30 MED ORDER — ONDANSETRON HCL 4 MG PO TABS: 4.0000 mg | ORAL_TABLET | Freq: Four times a day (QID) | ORAL | Status: DC | PRN

## 2021-05-30 MED ORDER — NICOTINE 21 MG/24HR TD PT24: 21.0000 mg | MEDICATED_PATCH | Freq: Every day | TRANSDERMAL | Status: DC | PRN

## 2021-05-30 MED ORDER — ACETAMINOPHEN 650 MG RE SUPP: 650.0000 mg | Freq: Four times a day (QID) | RECTAL | Status: DC | PRN

## 2021-05-30 MED ORDER — LORAZEPAM 2 MG/ML IJ SOLN: 2.0000 mg | INTRAMUSCULAR | Status: DC | PRN

## 2021-05-30 MED ORDER — ENOXAPARIN SODIUM 40 MG/0.4ML IJ SOSY
40.0000 mg | PREFILLED_SYRINGE | Freq: Every day | INTRAMUSCULAR | Status: DC
  Filled 2021-05-30: qty 0.4

## 2021-05-30 MED ORDER — SODIUM CHLORIDE 0.9 % IV BOLUS
1000.0000 mL | Freq: Once | INTRAVENOUS | Status: AC
Start: 2021-05-30 — End: 2021-05-30
  Administered 2021-05-30: 1000 mL via INTRAVENOUS

## 2021-05-30 NOTE — Assessment & Plan Note (Signed)
-   Patient expresses concern regarding ivermectin use.  Patient endorses that she had completed treatment course of ivermectin several days ago.  I acknowledge patient's concerns and counseled patient that since ivermectin has already been ingested and she has sufficient liver and renal function to metabolize/process the medication, I cannot undue the medications possible effects.  I then counseled patient that given the presentation of shortness of breath and palpitation with elevated troponin, cardiac work-up cannot be excluded at this time.  I thoroughly explained the reasoning for the treatment plan and she endorses agreement. ?

## 2021-05-30 NOTE — ED Provider Triage Note (Signed)
Emergency Medicine Provider Triage Evaluation Note ? ?Chelsea Hayes , a 41 y.o. female  was evaluated in triage.  Pt complains of complaints of palpitations and not feeling right.  Notes that her CO2 on her last labs had dropped 6 points.  Patient been given ivermectin for head lice as her permethrin had not worked.  States she just does not feel well.. ? ?Review of Systems  ?Positive: Palpitations ?Negative:  ? ?Physical Exam  ?BP (!) 161/107   Pulse 85   Resp 20   Ht 5\' 3"  (1.6 m)   Wt 77.1 kg   LMP  (LMP Unknown)   SpO2 98%   BMI 30.11 kg/m?  ?Gen:   Awake, no distress   ?Resp:  Normal effort  ?MSK:   Moves extremities without difficulty  ?Other:   ? ?Medical Decision Making  ?Medically screening exam initiated at 5:13 PM.  Appropriate orders placed.  was informed that the remainder of the evaluation will be completed by another provider, this initial triage assessment does not replace that evaluation, and the importance of remaining in the ED until their evaluation is complete. ? ?Cardiac work-up started ?  ?Chelsea Ponder, PA-C ?05/30/21 1714 ? ?

## 2021-05-30 NOTE — ED Triage Notes (Signed)
Pt via POV from home. Pt c/o palpitations and SOB. States that she was seen her on 05/28/2021 for same and was discharged. Pt states that she thinks its an reaction to Ivermectin that she was prescribed on 4/18 for head lice. Pt also endorses SOB, cough. Pt is A&OX4 and NAD ?

## 2021-05-30 NOTE — Assessment & Plan Note (Addendum)
-   She is currently not taking any medications outpatient ?- Ativan 2 mg IV as needed, for seizures, anxiety, 3 doses ordered with instructions to administer as needed and then let provider know ?

## 2021-05-30 NOTE — Assessment & Plan Note (Addendum)
With palpitations ?- Etiology work-up in progress ?- Given elevated troponin, atypical angina cannot be excluded at this time ?- Cardiology, Dr. Elberta Fortis has been consulted via staff message for a stress test ?- Admit to telemetry medical, observation ?

## 2021-05-30 NOTE — Hospital Course (Addendum)
[name] is a 41 year old female with history of obesity, hypertension, generalized anxiety disorder, seizures, hyperlipidemia, history of psychoactive substance abuse, currently in remission, who presents to the emergency department for chief concerns of palpitations and shortness of breath. ? ?Initial vitals in the emergency department show temperature of 98, respiration rate of 20, heart rate 85, blood pressure 161/107, SPO2 of 98% on room air. ? ?Serum sodium is 137, potassium 3.4, chloride 108, bicarb 22, BUN of 11, serum creatinine of 0.71, eGFR greater than 60, nonfasting blood glucose 92, WBC 10.4, hemoglobin 14.2, platelets of 246. ? ?D-dimer was mildly elevated at 0.58. ? ?High sensitive troponin was 20, and decreased to 19. ? ?Portable chest x-ray: Was read as normal chest radiographs. ? ?CTA of the chest: Was read as negative for pulmonary embolism, positive for small hiatal hernia. ? ?ED treatment: Sodium chloride 1 L bolus. ? ?

## 2021-05-30 NOTE — Assessment & Plan Note (Signed)
-   Cardiology has been consulted ?

## 2021-05-30 NOTE — ED Provider Notes (Signed)
? ?[facility] ?Provider Note ? ? ? Event Date/Time  ? First MD Initiated Contact with Patient 05/30/21 1843   ?  (approximate) ? ? ?History  ? ?Palpitations ? ? ?HPI ? ?Chelsea Hayes is a 41 y.o. female here with shortness of breath and palpitations.  Patient states that since taking ivermectin for head lice, she has had significant palpitations, shortness of breath, and multiple other systemic complaints.  She presents today because she has been having episodes in which she acutely feels like she is "breathing into a paper bag.  She feels like she cannot catch her breath.  She feels like her heart is beating very quickly during these episodes.  She feels lightheaded.  She was seen recently for this and had reassuring labs, was sent home.  She states that symptoms returned today and were fairly severe which is why she is here.  Denies known history of coronary or pulmonary disease.  She states she feels somewhat anxious.  Denies known cardiac history.  No history of DVT or PE.  No leg swelling. ?  ? ? ?Physical Exam  ? ?Triage Vital Signs: ?ED Triage Vitals  ?Enc Vitals Group  ?   BP 05/30/21 1709 (!) 161/107  ?   Pulse Rate 05/30/21 1709 85  ?   Resp 05/30/21 1709 20  ?   Temp --   ?   Temp Source 05/30/21 1709 Oral  ?   SpO2 05/30/21 1709 98 %  ?   Weight 05/30/21 1709 170 lb (77.1 kg)  ?   Height 05/30/21 1709 5\' 3"  (1.6 m)  ?   Head Circumference --   ?   Peak Flow --   ?   Pain Score 05/30/21 1707 0  ?   Pain Loc --   ?   Pain Edu? --   ?   Excl. in GC? --   ? ? ?Most recent vital signs: ?Vitals:  ? 05/31/21 0644 05/31/21 0828  ?BP: 101/64 112/76  ?Pulse: 78 70  ?Resp: 16 14  ?Temp: 98.3 ?F (36.8 ?C) 98.1 ?F (36.7 ?C)  ?SpO2: 99% 100%  ? ? ? ?General: Awake, no distress.  ?CV:  Good peripheral perfusion.  Regular rate and rhythm.  No murmurs appreciated. ?Resp:  Normal effort.  ?Abd:  No distention.  ?Other:  No leg swelling. ? ? ?ED Results / Procedures / Treatments   ? ?Labs ?(all labs ordered are listed, but only abnormal results are displayed) ?Labs Reviewed  ?CBC WITH DIFFERENTIAL/PLATELET - Abnormal; Notable for the following components:  ?    Result Value  ? RBC 5.25 (*)   ? All other components within normal limits  ?COMPREHENSIVE METABOLIC PANEL - Abnormal; Notable for the following components:  ? Potassium 3.4 (*)   ? Calcium 8.7 (*)   ? AST 14 (*)   ? All other components within normal limits  ?D-DIMER, QUANTITATIVE - Abnormal; Notable for the following components:  ? D-Dimer, Quant 0.58 (*)   ? All other components within normal limits  ?BASIC METABOLIC PANEL - Abnormal; Notable for the following components:  ? Potassium 3.2 (*)   ? CO2 21 (*)   ? Calcium 8.4 (*)   ? Anion gap 4 (*)   ? All other components within normal limits  ?TROPONIN I (HIGH SENSITIVITY) - Abnormal; Notable for the following components:  ? Troponin I (High Sensitivity) 20 (*)   ? All other components within normal limits  ?TROPONIN  I (HIGH SENSITIVITY) - Abnormal; Notable for the following components:  ? Troponin I (High Sensitivity) 19 (*)   ? All other components within normal limits  ?MRSA NEXT GEN BY PCR, NASAL  ?CBC  ?HIV ANTIBODY (ROUTINE TESTING W REFLEX)  ?BRAIN NATRIURETIC PEPTIDE  ?PREGNANCY, URINE  ?MAGNESIUM  ?URINE DRUG SCREEN, QUALITATIVE ([facility] ONLY)  ? ? ? ?EKG ?Normal sinus rhythm, ventricular rate 91.  PR 168, QRS 86, QTc 452.  No acute ST elevations or depressions. ? ? ?RADIOLOGY ?CT angio: No PE ?Chest x-ray: No active disease ? ? ?I also independently reviewed and agree wit radiologist interpretations. ? ? ?PROCEDURES: ? ?Critical Care performed: No ? ?.1-3 Lead EKG Interpretation ?Performed by: Shaune Pollack, MD ?Authorized by: Shaune Pollack, MD  ? ?  Interpretation: normal   ?  ECG rate:  70-90 ?  ECG rate assessment: normal   ?  Rhythm: sinus rhythm   ?  Ectopy: none   ?  Conduction: normal   ?Comments:  ?   Indication: chest pain ? ? ? ?MEDICATIONS ORDERED IN  ED: ?Medications  ?acetaminophen (TYLENOL) tablet 650 mg (has no administration in time range)  ?  Or  ?acetaminophen (TYLENOL) suppository 650 mg (has no administration in time range)  ?ondansetron (ZOFRAN) tablet 4 mg (has no administration in time range)  ?  Or  ?ondansetron (ZOFRAN) injection 4 mg (has no administration in time range)  ?enoxaparin (LOVENOX) injection 40 mg (40 mg Subcutaneous Patient Refused/Not Given 05/30/21 2237)  ?LORazepam (ATIVAN) injection 2 mg (has no administration in time range)  ?nicotine (NICODERM CQ - dosed in mg/24 hours) patch 21 mg (has no administration in time range)  ?sodium chloride 0.9 % bolus 1,000 mL (0 mLs Intravenous Stopped 05/30/21 2237)  ?iohexol (OMNIPAQUE) 350 MG/ML injection 50 mL (50 mLs Intravenous Contrast Given 05/30/21 2126)  ?potassium chloride SA (KLOR-CON M) CR tablet 40 mEq (40 mEq Oral Given 05/31/21 1205)  ? ? ? ?IMPRESSION / MDM / ASSESSMENT AND PLAN / ED COURSE  ?I reviewed the triage vital signs and the nursing notes. ?             ?               ? ? ?The patient is on the cardiac monitor to evaluate for evidence of arrhythmia and/or significant heart rate changes. ? ? ?Ddx:  ? Arrhythmia, anxiety, anemia, hyperthyroidism, adverse drug effect, asthma/RAD, PE, ACS ? ? ?MDM:  ?41 yo F with h/o anxiety here with transient but persistently worsening episodes of SOB, palpitations. Pt feels these sx all started after taking Ivermectin. Clinically, she appears well though anxious. Lungs CTAB. No appreciable murmurs noted. No h/o prior cardiac disease. No h/o similar sx prior to taking Ivermectin. While I suspect the ivermectin should no longer be in her system, unclear whether this is related to GI distress 2/2 it causing reflux/palpitations sensation, or an underlying arrhythmia or cardiogenic source. CBC without leukocytosis or anemia. EKG nonischemic, but trop elevated at 20 which is abnormal esp given her age. Will admit for, obs, possible echo. D-Dimer  sent and was positive, so CT Angi obtained and is unremarkable. Admit ot medicine. ? ? ?MEDICATIONS GIVEN IN ED: ?Medications  ?acetaminophen (TYLENOL) tablet 650 mg (has no administration in time range)  ?  Or  ?acetaminophen (TYLENOL) suppository 650 mg (has no administration in time range)  ?ondansetron (ZOFRAN) tablet 4 mg (has no administration in time range)  ?  Or  ?  ondansetron (ZOFRAN) injection 4 mg (has no administration in time range)  ?enoxaparin (LOVENOX) injection 40 mg (40 mg Subcutaneous Patient Refused/Not Given 05/30/21 2237)  ?LORazepam (ATIVAN) injection 2 mg (has no administration in time range)  ?nicotine (NICODERM CQ - dosed in mg/24 hours) patch 21 mg (has no administration in time range)  ?sodium chloride 0.9 % bolus 1,000 mL (0 mLs Intravenous Stopped 05/30/21 2237)  ?iohexol (OMNIPAQUE) 350 MG/ML injection 50 mL (50 mLs Intravenous Contrast Given 05/30/21 2126)  ?potassium chloride SA (KLOR-CON M) CR tablet 40 mEq (40 mEq Oral Given 05/31/21 1205)  ? ? ? ?Consults:  ?Hospitalist consulted for admission ? ? ?EMR reviewed  ?Reviewed PCP visits and recent ED visit for reactions to permethrin and ivermectin ? ? ? ? ?FINAL CLINICAL IMPRESSION(S) / ED DIAGNOSES  ? ?Final diagnoses:  ?Palpitations  ?Shortness of breath  ? ? ? ?Rx / DC Orders  ? ?ED Discharge Orders   ? ? None  ? ?  ? ? ? ?Note:  This document was prepared using Dragon voice recognition software and may include unintentional dictation errors. ?  ?Shaune PollackIsaacs, Shaquilla Kehres, MD ?05/31/21 1223 ? ?
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
## 2021-05-30 NOTE — Assessment & Plan Note (Signed)
-   Nicotine patch, 21 mg every 24 hours as needed for nicotine craving ordered ?

## 2021-05-30 NOTE — Assessment & Plan Note (Signed)
-   We will check magnesium ?

## 2021-05-31 ENCOUNTER — Observation Stay (HOSPITAL_BASED_OUTPATIENT_CLINIC_OR_DEPARTMENT_OTHER)
Admit: 2021-05-31 | Discharge: 2021-05-31 | Disposition: A | Discharge status: Home / Self Care | Payer: Medicaid Other | Attending: Internal Medicine | Admitting: Internal Medicine

## 2021-05-31 ENCOUNTER — Encounter: Payer: Self-pay | Admitting: Internal Medicine

## 2021-05-31 DIAGNOSIS — R0609 Other forms of dyspnea: Secondary | ICD-10-CM

## 2021-05-31 DIAGNOSIS — R002 Palpitations: Secondary | ICD-10-CM

## 2021-05-31 DIAGNOSIS — F419 Anxiety disorder, unspecified: Secondary | ICD-10-CM

## 2021-05-31 DIAGNOSIS — R0602 Shortness of breath: Secondary | ICD-10-CM | POA: Diagnosis not present

## 2021-05-31 DIAGNOSIS — R778 Other specified abnormalities of plasma proteins: Secondary | ICD-10-CM | POA: Diagnosis not present

## 2021-05-31 LAB — CBC
HCT: 36.9 % (ref 36.0–46.0)
Hemoglobin: 12.1 g/dL (ref 12.0–15.0)
MCH: 26.8 pg (ref 26.0–34.0)
MCHC: 32.8 g/dL (ref 30.0–36.0)
MCV: 81.6 fL (ref 80.0–100.0)
Platelets: 199 10*3/uL (ref 150–400)
RBC: 4.52 MIL/uL (ref 3.87–5.11)
RDW: 13.5 % (ref 11.5–15.5)
WBC: 6.8 10*3/uL (ref 4.0–10.5)
nRBC: 0 % (ref 0.0–0.2)

## 2021-05-31 LAB — URINE DRUG SCREEN, QUALITATIVE (ARMC ONLY)
Amphetamines, Ur Screen: NOT DETECTED
Barbiturates, Ur Screen: NOT DETECTED
Benzodiazepine, Ur Scrn: NOT DETECTED
Cannabinoid 50 Ng, Ur ~~LOC~~: NOT DETECTED
Cocaine Metabolite,Ur ~~LOC~~: NOT DETECTED
MDMA (Ecstasy)Ur Screen: NOT DETECTED
Methadone Scn, Ur: NOT DETECTED
Opiate, Ur Screen: NOT DETECTED
Phencyclidine (PCP) Ur S: NOT DETECTED
Tricyclic, Ur Screen: NOT DETECTED

## 2021-05-31 LAB — ECHOCARDIOGRAM COMPLETE
AR max vel: 2.8 cm2
AV Area VTI: 2.66 cm2
AV Area mean vel: 2.39 cm2
AV Mean grad: 4 mmHg
AV Peak grad: 6.5 mmHg
Ao pk vel: 1.27 m/s
Area-P 1/2: 3.17 cm2
Height: 63 in
MV VTI: 1.96 cm2
S' Lateral: 2.79 cm
Weight: 2720 oz

## 2021-05-31 LAB — BASIC METABOLIC PANEL
Anion gap: 4 — ABNORMAL LOW (ref 5–15)
BUN: 8 mg/dL (ref 6–20)
CO2: 21 mmol/L — ABNORMAL LOW (ref 22–32)
Calcium: 8.4 mg/dL — ABNORMAL LOW (ref 8.9–10.3)
Chloride: 111 mmol/L (ref 98–111)
Creatinine, Ser: 0.65 mg/dL (ref 0.44–1.00)
GFR, Estimated: >60 mL/min (ref >60)
Glucose, Bld: 89 mg/dL (ref 70–99)
Potassium: 3.2 mmol/L — ABNORMAL LOW (ref 3.5–5.1)
Sodium: 136 mmol/L (ref 135–145)

## 2021-05-31 LAB — MAGNESIUM: Magnesium: 2 mg/dL (ref 1.7–2.4)

## 2021-05-31 LAB — HIV ANTIBODY (ROUTINE TESTING W REFLEX): HIV Screen 4th Generation wRfx: NONREACTIVE

## 2021-05-31 LAB — MRSA NEXT GEN BY PCR, NASAL: MRSA by PCR Next Gen: NOT DETECTED

## 2021-05-31 MED ORDER — POTASSIUM CHLORIDE CRYS ER 20 MEQ PO TBCR
40.0000 meq | EXTENDED_RELEASE_TABLET | ORAL | Status: AC
  Administered 2021-05-31 (×2): 40 meq via ORAL
  Filled 2021-05-31 (×2): qty 2

## 2021-05-31 NOTE — Discharge Summary (Signed)
?Physician Discharge Summary  ?Chelsea Hayes ZDG:[id number] DOB: [birth year] DOA: 05/30/2021 ? ?PCP: System, Provider Not In ? ?Admit date: 05/30/2021 ?Discharge date: 05/31/2021 ? ?Admitted From: Home ?Disposition: Home ? ?Recommendations for Outpatient Follow-up:  ?Follow up with PCP in 1-2 weeks ?Follow-up with cardiology outpatient ?Would benefit from referral for outpatient psychiatry for treatment of anxiety ? ?Home Health: No ?Equipment/Devices: None ? ?Discharge Condition: Stable ?CODE STATUS: Full code ?Diet recommendation: Regular diet ? ?History of present illness: ? ?Chelsea Hayes is a 41 year old female with past medical history significant for anxiety, HTN, HLD, obesity, chronic pain, seizures, prior polysubstance abuse who presented to [facility] ED on 4/24 with complaints of palpitations and shortness of breath.  Patient with multiple ED presentations over the last 2 weeks, initially due to skin irritation thought to be from lice and was prescribed ivermectin.  Patient reports that she took 2 doses of ivermectin within 3 days of each other; and relates that this is a possible side effect from the medication.  Patient also concerned about living near hypertension wires and a 5G cell tower.  Reports that she has been clean from illicit drug use for 3 years. ? ?In the ED, temperature 98.0 ?F, HR 85, RR 20, BP 161/107, SPO2 98% on room air.  WBC 6.8, hemoglobin 12.1, platelets 199.  Sodium 136, potassium 3.2, chloride 111, CO2 21, glucose 89, BUN 8, creatinine 0.65.  Magnesium 2.0.  AST 14, ALT 14, total bilirubin 0.6.  High sensitive troponin 20>19.  BNP 15.7.  D-dimer 0.58.  Urine pregnancy screen negative.  UDS negative.  Chest x-ray with no acute cardiopulmonary disease process.  CTA chest negative for pulmonary embolism or acute intrathoracic abnormality.  EKG with normal sinus rhythm, no concerning dynamic changes.  Given recurrent ED presentations and elevated troponin, hospitalist  service was consulted for further evaluation management. ? ? ?Hospital course: ? ?Elevated troponin ?Patient with mildly elevated troponin of 20 followed by 19 on arrival.  EKG with normal sinus rhythm with no concerning dynamic changes.  Patient with elevated D-dimer, CT angiogram chest negative for pulmonary embolism or other abnormality.  UDS negative.  TTE with LVEF 60-65%, no regional wall motion abnormalities LV, trivial MR, IVC normal in size.  Was seen by cardiology with recommendation of outpatient follow-up for consideration of ischemic/anatomic testing. ? ?Palpitations ?Hyperventilation ?Patient with several day history of palpitations, shortness of breath and felt that she was hyperventilating.  The symptoms resolved and patient believes this may be a side effect from taking several doses of ivermectin.  EKG with normal sinus rhythm, patient was monitored on telemetry with no events noted.  Also likely anxiety playing a large role. ? ?Hypokalemia ?Repleted during hospitalization. ? ?Elevated blood pressure ?Not on an hypertensives outpatient.  Currently now blood pressures have normalized.  Etiology likely secondary to high anxiety state on initial ED presentation. ? ?Anxiety ?Patient with several ED presentations concerned about itching and sensation of [place] under her skin.  Was given treatment with ivermectin by ED physician.  Also has high concerns related to high tension wires and living next with 5G cellular tower; as this may be impacting her health.  Would likely benefit from further discussions with PCP versus outpatient referral to psychiatry for further evaluation. ? ? ? ? ?Discharge Diagnoses:  ?Principal Problem: ?  Shortness of breath ?Active Problems: ?  Generalized anxiety disorder ?  Hyperlipidemia ?  ADHD (attention deficit hyperactivity disorder) ?  Seizures ([facility]) ?  Tobacco abuse ?  Hypokalemia ?  Elevated troponin ?  Concerned about care plan ? ? ? ?Discharge  Instructions ? ?Discharge Instructions   ? ? Diet - low sodium heart healthy   Complete by: As directed ?  ? Increase activity slowly   Complete by: As directed ?  ? ?  ? ?Allergies as of 05/31/2021   ? ?   Reactions  ? Lunesta [eszopiclone] Anaphylaxis  ? Pregabalin Anaphylaxis  ? ?  ? ?  ?Medication List  ?  ? ?STOP taking these medications   ? ?ALPRAZolam 1 MG tablet ?Commonly known as: XANAX ?  ?amphetamine-dextroamphetamine 30 MG tablet ?Commonly known as: ADDERALL ?  ?Buprenorphine HCl-Naloxone HCl 8-2 MG Film ?  ?carbamazepine 100 MG chewable tablet ?Commonly known as: TEGRETOL ?  ?diclofenac 50 MG tablet ?Commonly known as: CATAFLAM ?  ?DULoxetine 60 MG capsule ?Commonly known as: CYMBALTA ?  ?gabapentin 100 MG capsule ?Commonly known as: NEURONTIN ?  ?levETIRAcetam 500 MG tablet ?Commonly known as: KEPPRA ?  ?methocarbamol 500 MG tablet ?Commonly known as: ROBAXIN ?  ?permethrin 5 % cream ?Commonly known as: ELIMITE ?  ?venlafaxine XR 75 MG 24 hr capsule ?Commonly known as: EFFEXOR-XR ?  ? ?  ? ? Follow-up Information   ? ? End, Cristal Deerhristopher, MD. Schedule an appointment as soon as possible for a visit.   ?Specialty: Cardiology ?Contact information: ?[address] ?[address] ?[place] [id number] ?[phone] ? ? ?  ?  ? ?  ?  ? ?  ? ?Allergies  ?Allergen Reactions  ? Lunesta [Eszopiclone] Anaphylaxis  ? Pregabalin Anaphylaxis  ? ? ?Consultations: ?Cardiology, Dr. Okey DupreEnd ? ? ?Procedures/Studies: ?DG Chest 2 View ? ?Result Date: 05/30/2021 ?CLINICAL DATA:  Shortness of breath EXAM: CHEST - 2 VIEW COMPARISON:  05/28/2021 FINDINGS: Lungs are clear.  No pleural effusion or pneumothorax. The heart is normal in size. Visualized osseous structures are within normal limits. IMPRESSION: Normal chest radiographs. Electronically Signed   By: Charline BillsSriyesh  Krishnan M.D.   On: 05/30/2021 21:22  ? ?DG Chest 2 View ? ?Result Date: 05/28/2021 ?CLINICAL DATA:  Shortness of breath.  Palpitations. EXAM: CHEST - 2 VIEW COMPARISON:   02/01/2015 FINDINGS: The heart size and mediastinal contours are within normal limits. Both lungs are clear. The visualized skeletal structures are unremarkable. IMPRESSION: No active cardiopulmonary disease. Electronically Signed   By: Danae OrleansJohn A Stahl M.D.   On: 05/28/2021 14:41  ? ?CT Angio Chest PE W and/or Wo Contrast ? ?Result Date: 05/30/2021 ?CLINICAL DATA:  Pulmonary embolism (PE) suspected, high prob Shortness of breath and palpitation. EXAM: CT ANGIOGRAPHY CHEST WITH CONTRAST TECHNIQUE: Multidetector CT imaging of the chest was performed using the standard protocol during bolus administration of intravenous contrast. Multiplanar CT image reconstructions and MIPs were obtained to evaluate the vascular anatomy. RADIATION DOSE REDUCTION: This exam was performed according to the departmental dose-optimization program which includes automated exposure control, adjustment of the mA and/or kV according to patient size and/or use of iterative reconstruction technique. CONTRAST:  50mL OMNIPAQUE IOHEXOL 350 MG/ML SOLN COMPARISON:  Radiograph earlier today and 05/28/2021 FINDINGS: Cardiovascular: There are no filling defects within the pulmonary arteries to suggest pulmonary embolus. Normal caliber thoracic aorta without dissection or acute aortic findings. The heart is normal in size. No pericardial effusion. Mediastinum/Nodes: No mediastinal or hilar adenopathy. Minimal residual thymus in the anterior mediastinum. No mediastinal mass. No visualized thyroid nodule. Small hiatal hernia. Lungs/Pleura: Minor subsegmental atelectasis in the right lower lobe. The lungs are otherwise clear.  No confluent consolidation or pneumonia. No pleural effusion. No pulmonary mass or nodule. Upper Abdomen: No acute or unexpected finding. Musculoskeletal: There are no acute or suspicious osseous abnormalities. No chest wall soft tissue abnormalities. Review of the MIP images confirms the above findings. IMPRESSION: 1. No pulmonary embolus  or acute intrathoracic abnormality. 2. Small hiatal hernia. Electronically Signed   By: Narda Rutherford M.D.   On: 05/30/2021 21:41  ? ?ECHOCARDIOGRAM COMPLETE ? ?Result Date: 05/31/2021 ?   ECHOCARDIOGRAM REPORT   Patient Name
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
## 2021-05-31 NOTE — TOC Initial Note (Signed)
Transition of Care (TOC) - Initial/Assessment Note  ? ? ?Patient Details  ?Name: Meriah Shands ?MRN: [id number] ?Date of Birth: [birth year] ? ?Transition of Care (TOC) CM/SW Contact:    ?Marlowe Sax, RN ?Phone Number: ?05/31/2021, 9:39 AM ? ?Clinical Narrative:                 ? ?Transition of Care (TOC) Screening Note ? ? ?Patient Details  ?Name: Betti Goodenow ?Date of Birth: 18-Oct-1980 ? ? ?Transition of Care (TOC) CM/SW Contact:    ?Marlowe Sax, RN ?Phone Number: ?05/31/2021, 9:39 AM ? ? ? ?Transition of Care Department [facility]) has reviewed patient and no TOC needs have been identified at this time. We will continue to monitor patient advancement through interdisciplinary progression rounds. If new patient transition needs arise, please place a TOC consult. ?  ? ?  ?  ? ? ?Patient Goals and CMS Choice ?  ?  ?  ? ?Expected Discharge Plan and Services ?  ?  ?  ?  ?  ?                ?  ?  ?  ?  ?  ?  ?  ?  ?  ?  ? ?Prior Living Arrangements/Services ?  ?  ?  ?       ?  ?  ?  ?  ? ?Activities of Daily Living ?Home Assistive Devices/Equipment: None ?ADL Screening (condition at time of admission) ?Patient's cognitive ability adequate to safely complete daily activities?: No ?Is the patient deaf or have difficulty hearing?: No ?Does the patient have difficulty seeing, even when wearing glasses/contacts?: No ?Does the patient have difficulty concentrating, remembering, or making decisions?: No ?Patient able to express need for assistance with ADLs?: No ?Does the patient have difficulty dressing or bathing?: No ?Independently performs ADLs?: Yes (appropriate for developmental age) ?Does the patient have difficulty walking or climbing stairs?: No ?Weakness of Legs: None ?Weakness of Arms/Hands: None ? ?Permission Sought/Granted ?  ?  ?   ?   ?   ?   ? ?Emotional Assessment ?  ?  ?  ?  ?  ?  ? ?Admission diagnosis:  Shortness of breath [R06.02] ?Patient Active Problem List  ? Diagnosis Date  Noted  ? Shortness of breath 05/30/2021  ? Hypokalemia 05/30/2021  ? Elevated troponin 05/30/2021  ? Concerned about care plan 05/30/2021  ? Seizures (HCC) 05/24/2021  ? Generalized anxiety disorder 10/04/2018  ? Heroin withdrawal (HCC) 02/05/2015  ? Neck pain 08/15/2013  ? Tobacco abuse 08/15/2013  ? Back pain 08/15/2013  ? ADHD (attention deficit hyperactivity disorder) 02/12/2012  ? History of cardiomegaly 02/12/2012  ? Chronic back pain 01/15/2011  ? Hyperlipidemia   ? Overweight(278.02) 12/12/2006  ? ?PCP:  System, Provider Not In ?Pharmacy:   ?[id number] [place], [place] - [address] ?[address] ?[place] [address] ?Phone: [phone] Fax: [phone] ? ? ? ? ?Social Determinants of Health (SDOH) Interventions ?  ? ?Readmission Risk Interventions ?   ? View : No data to display.  ?  ?  ?  ? ? ? ?

## 2021-05-31 NOTE — Plan of Care (Signed)

## 2021-06-30 ENCOUNTER — Ambulatory Visit (INDEPENDENT_AMBULATORY_CARE_PROVIDER_SITE_OTHER): Payer: Medicaid Other | Admitting: Cardiology

## 2021-06-30 ENCOUNTER — Other Ambulatory Visit
Admission: RE | Admit: 2021-06-30 | Discharge: 2021-06-30 | Disposition: A | Discharge status: Home / Self Care | Payer: Medicaid Other | Source: Ambulatory Visit | Attending: Cardiology | Admitting: Cardiology

## 2021-06-30 ENCOUNTER — Encounter: Payer: Self-pay | Admitting: Cardiology

## 2021-06-30 ENCOUNTER — Ambulatory Visit (INDEPENDENT_AMBULATORY_CARE_PROVIDER_SITE_OTHER): Payer: Medicaid Other

## 2021-06-30 VITALS — BP 130/90 | HR 76 | Ht 63.0 in | Wt 181.0 lb

## 2021-06-30 DIAGNOSIS — R002 Palpitations: Secondary | ICD-10-CM

## 2021-06-30 DIAGNOSIS — E7800 Pure hypercholesterolemia, unspecified: Secondary | ICD-10-CM

## 2021-06-30 LAB — LIPID PANEL
Cholesterol: 266 mg/dL — ABNORMAL HIGH (ref 0–200)
HDL: 57 mg/dL (ref >40)
LDL Cholesterol: 174 mg/dL — ABNORMAL HIGH (ref 0–99)
Total CHOL/HDL Ratio: 4.7 RATIO
Triglycerides: 173 mg/dL — ABNORMAL HIGH (ref <150)
VLDL: 35 mg/dL (ref 0–40)

## 2021-06-30 NOTE — Patient Instructions (Signed)
Medication Instructions:   Your physician recommends that you continue on your current medications as directed. Please refer to the Current Medication list given to you today.  *If you need a refill on your cardiac medications before your next appointment, please call your pharmacy*   Lab Work:  Your physician recommends that you have a FASTING lipid profile drawn today. Please go to the [facility] after your appointment today to have this drawn.   Testing/Procedures:  Your physician has recommended that you wear a Zio XT monitor for 2 weeks. This will be mailed to your home address in 4-5 business days.   This monitor is a medical device that records the heart's electrical activity. Doctors most often use these monitors to diagnose arrhythmias. Arrhythmias are problems with the speed or rhythm of the heartbeat. The monitor is a small device applied to your chest. You can wear one while you do your normal daily activities. While wearing this monitor if you have any symptoms to push the button and record what you felt. Once you have worn this monitor for the period of time provider prescribed (Usually 14 days), you will return the monitor device in the postage paid box. Once it is returned they will download the data collected and provide [place] with a report which the provider will then review and we will call you with those results. Important tips:  Avoid showering during the first 24 hours of wearing the monitor. Avoid excessive sweating to help maximize wear time. Do not submerge the device, no hot tubs, and no swimming pools. Keep any lotions or oils away from the patch. After 24 hours you may shower with the patch on. Take brief showers with your back facing the shower head.  Do not remove patch once it has been placed because that will interrupt data and decrease adhesive wear time. Push the button when you have any symptoms and write down what you were feeling. Once you have completed  wearing your monitor, remove and place into box which has postage paid and place in your outgoing mailbox.  If for some reason you have misplaced your box then call our office and we can provide another box and/or mail it off for you.    Follow-Up: At [facility], you and your health needs are our priority.  As part of our continuing mission to provide you with exceptional heart care, we have created designated Provider Care Teams.  These Care Teams include your primary Cardiologist (physician) and Advanced Practice Providers (APPs -  Physician Assistants and Nurse Practitioners) who all work together to provide you with the care you need, when you need it.  We recommend signing up for the patient portal called "MyChart".  Sign up information is provided on this After Visit Summary.  MyChart is used to connect with patients for Virtual Visits (Telemedicine).  Patients are able to view lab/test results, encounter notes, upcoming appointments, etc.  Non-urgent messages can be sent to your provider as well.   To learn more about what you can do with MyChart, go to [email or web address].    Your next appointment:   6-8 week(s)  The format for your next appointment:   In Person  Provider:   You may see Yvonne Kendall, MD or one of the following Advanced Practice Providers on your designated Care Team:   Nicolasa Ducking, NP Eula Listen, PA-C Cadence Fransico Michael, [place]     Important Information About Sugar
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
## 2021-07-03 DIAGNOSIS — R002 Palpitations: Secondary | ICD-10-CM | POA: Diagnosis not present

## 2021-07-05 ENCOUNTER — Telehealth: Payer: Self-pay

## 2021-07-05 MED ORDER — ATORVASTATIN CALCIUM 40 MG PO TABS: 40.0000 mg | ORAL_TABLET | Freq: Every day | ORAL | 3 refills | Status: AC

## 2021-07-05 NOTE — Telephone Encounter (Signed)
Called patient and informed her of the result note below. Patient immediately began elevating her voice and told me she will not take any medication until we find out what is wrong with her heart. She then began to tell me she went to her PCP and they drew labs, but that her blood would not come out. She also started telling me how she had a rash all over. When I tried to redirect the conversation by informing her that did not have anything to do with her cholesterol, She screamed into the phone, " I already know that! I am hanging up on you!"    Patient did hang up. Result note was sent through MyChart and prescription sent in.   I informed Dr. Azucena Cecil of this conversation and that I was worried about having to call her with the Zio results when they came in. He advised avoid telephone communication with her, and to send results through MyChart only. Patient is to follow up with Cadence Furth as scheduled.

## 2021-07-05 NOTE — Progress Notes (Signed)
See telephone note.

## 2021-07-05 NOTE — Telephone Encounter (Signed)
-----   Message from Debbe Odea, MD sent at 06/30/2021  5:56 PM EDT ----- Cholesterol levels elevated/abnormal.  Recommend low-cholesterol diet.  Recommend starting Lipitor 40 mg daily if patient is agreeable.

## 2021-07-07 ENCOUNTER — Ambulatory Visit (HOSPITAL_COMMUNITY)
Admission: RE | Admit: 2021-07-07 | Discharge: 2021-07-07 | Disposition: A | Discharge status: Home / Self Care | Payer: Medicaid Other | Attending: Psychiatry | Admitting: Psychiatry
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
## 2021-07-20 NOTE — Telephone Encounter (Signed)
Patient came to office to discuss if zio report is resulted.    Patient also has additional questions about cholesterol results and what they mean.    Patient states mychart would be ok but she prefers a phone call when results are available.    Patient aware report pending and call back may be next week or through mychart.

## 2021-07-22 NOTE — Telephone Encounter (Signed)
Sent MyChart message to patient requesting any questions regarding her cholesterol to come through MyChart.

## 2021-07-22 NOTE — Telephone Encounter (Signed)
Sent normal Zio results to patients MyChart.

## 2021-07-25 ENCOUNTER — Telehealth: Payer: Self-pay | Admitting: Internal Medicine

## 2021-07-25 NOTE — Telephone Encounter (Signed)
Patient c/o Palpitations:  High priority if patient c/o lightheadedness, shortness of breath, or chest pain  How long have you had palpitations/irregular HR/ Afib? Are you having the symptoms now?  Palpitations--patient states she can feel her heart racing. Last occurrence was prior to calling while eating, but she is fine now. This has been going on for the past few month on and off  Are you currently experiencing lightheadedness, SOB or CP?  No   Do you have a history of afib (atrial fibrillation) or irregular heart rhythm?  No   Have you checked your BP or HR? (document readings if available):  No   Are you experiencing any other symptoms?  SOB, not currently

## 2021-07-28 NOTE — Telephone Encounter (Addendum)
Patient stated that she does not believe the Zio rhythm monitor worked. She stated she called the company because she felt she was having irregular heart rhythms while wearing it, however the report does not reflect that. She stated that natural gas in the environment is causing a lot of this, and is worried about coming to the hospital because she said the natural gas is worse here and cause severe heart racing.   I informed her that the monitor was recording her heart rhythm the entire time she was wearing it. Patient did not believe that to be true.   I advised that she discuss this with Cadence Furth at her follow up on Monday 6/26. Patient agreed with plan and confirmed that she will be here.

## 2021-08-01 ENCOUNTER — Encounter: Payer: Self-pay | Admitting: Medical

## 2021-08-01 ENCOUNTER — Ambulatory Visit (INDEPENDENT_AMBULATORY_CARE_PROVIDER_SITE_OTHER): Payer: Medicaid Other | Admitting: Medical

## 2021-08-01 VITALS — BP 126/80 | HR 73 | Ht 63.0 in | Wt 176.2 lb

## 2021-08-01 DIAGNOSIS — R002 Palpitations: Secondary | ICD-10-CM | POA: Diagnosis not present

## 2021-08-01 DIAGNOSIS — E7800 Pure hypercholesterolemia, unspecified: Secondary | ICD-10-CM | POA: Diagnosis not present

## 2021-08-03 NOTE — Addendum Note (Signed)
Addended by: Auburn Bilberry D on: 08/03/2021 12:01 PM   Modules accepted: Orders

## 2021-08-11 ENCOUNTER — Ambulatory Visit: Payer: Medicaid Other | Admitting: Medical

## 2021-08-18 ENCOUNTER — Encounter: Payer: Self-pay | Admitting: Internal Medicine

## 2021-08-18 ENCOUNTER — Ambulatory Visit (INDEPENDENT_AMBULATORY_CARE_PROVIDER_SITE_OTHER): Payer: Medicaid Other | Admitting: Internal Medicine

## 2021-08-18 VITALS — BP 132/88 | HR 92 | Ht 63.0 in | Wt 173.6 lb

## 2021-08-18 DIAGNOSIS — R0602 Shortness of breath: Secondary | ICD-10-CM | POA: Diagnosis not present

## 2021-08-18 DIAGNOSIS — E876 Hypokalemia: Secondary | ICD-10-CM | POA: Diagnosis not present

## 2021-08-18 LAB — BASIC METABOLIC PANEL
BUN: 9 mg/dL (ref 6–23)
CO2: 22 mEq/L (ref 19–32)
Calcium: 9.6 mg/dL (ref 8.4–10.5)
Chloride: 106 mEq/L (ref 96–112)
Creatinine, Ser: 0.84 mg/dL (ref 0.40–1.20)
GFR: 87.35 mL/min (ref >60.00)
Glucose, Bld: 99 mg/dL (ref 70–99)
Potassium: 3.8 mEq/L (ref 3.5–5.1)
Sodium: 138 mEq/L (ref 135–145)
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
## 2021-08-18 NOTE — Patient Instructions (Addendum)
Please schedule follow up scheduled with APP in 1 months.  Before your next visit I would like you to have:  Full set of PFTs - follow up with APP afterwards Blood work today to follow up on potassium and CO2 level.

## 2021-09-21 ENCOUNTER — Ambulatory Visit: Payer: Medicaid Other | Admitting: Primary Care

## 2022-01-13 ENCOUNTER — Ambulatory Visit: Payer: Medicaid Other | Attending: Medical | Admitting: Medical

## 2022-01-13 ENCOUNTER — Encounter: Payer: Self-pay | Admitting: Medical
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
## 2022-04-26 IMAGING — CT CT ANGIO CHEST
2 of 6 series · 18 of 46 positions shown · IV contrast (APPLIED)
Comparison: Radiograph earlier today and 05/28/2021

CLINICAL DATA: Pulmonary embolism (PE) suspected, high prob

Shortness of breath and palpitation.
EXAM:
CT ANGIOGRAPHY CHEST WITH CONTRAST
TECHNIQUE: Multidetector CT imaging of the chest was performed using the
standard protocol during bolus administration of intravenous
contrast. Multiplanar CT image reconstructions and MIPs were
obtained to evaluate the vascular anatomy.

[Series 6: thins · axial · 0.67mm/px · z∈[-532,-304]mm · 15 of 358 slices shown]
[im 16/358  lung]
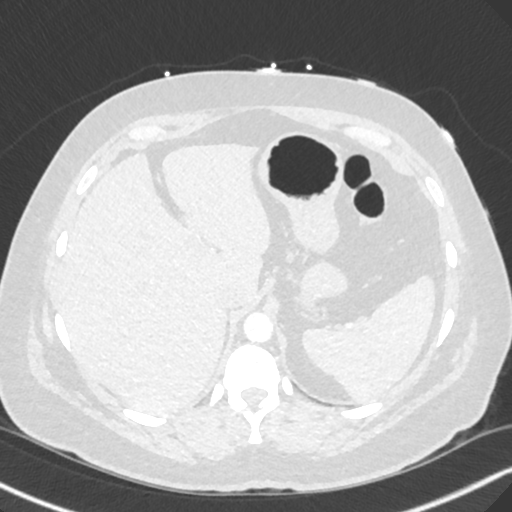
[im 47/358  soft-tissue]
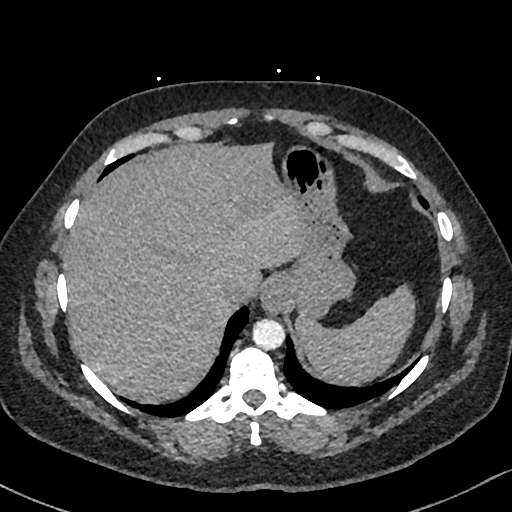
[im 63/358  lung]
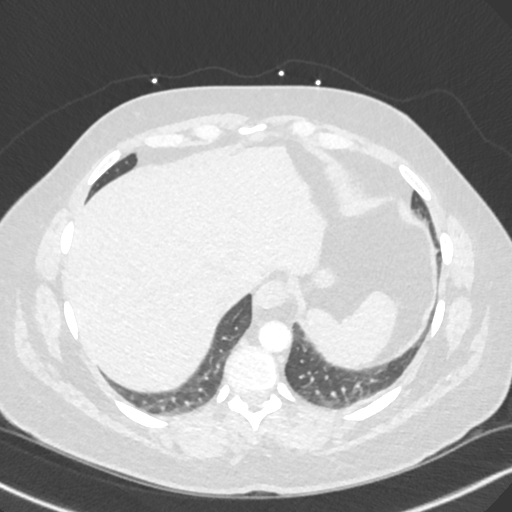
[im 94/358  soft-tissue]
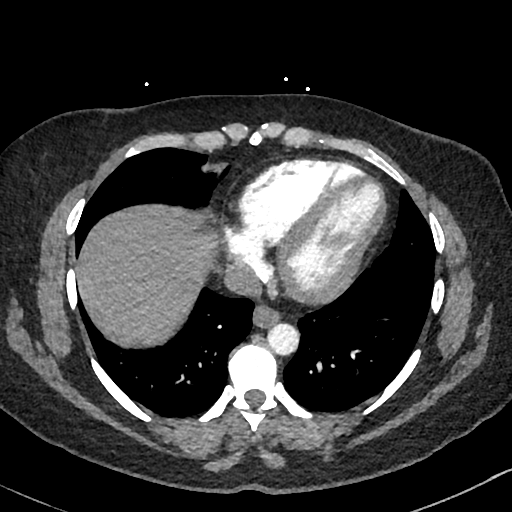
[im 109/358  lung]
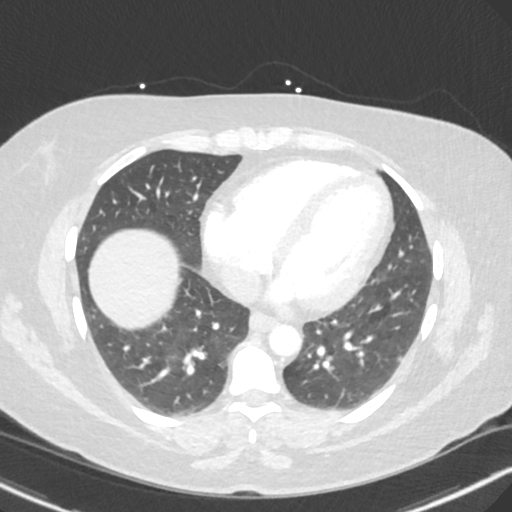
[im 140/358  soft-tissue]
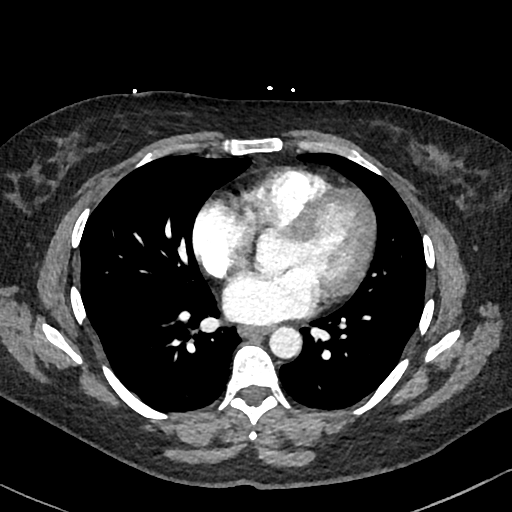
[im 156/358  lung]
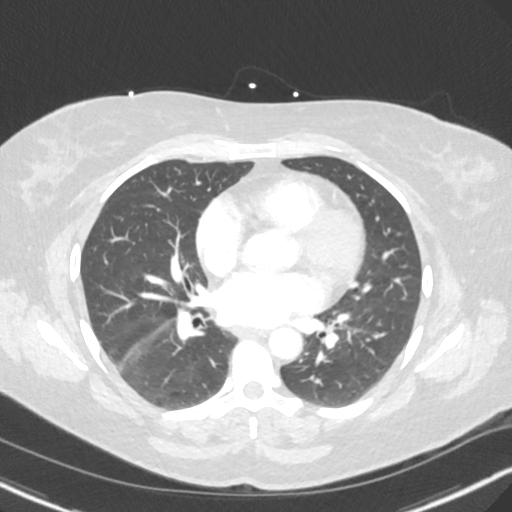
[im 187/358  soft-tissue]
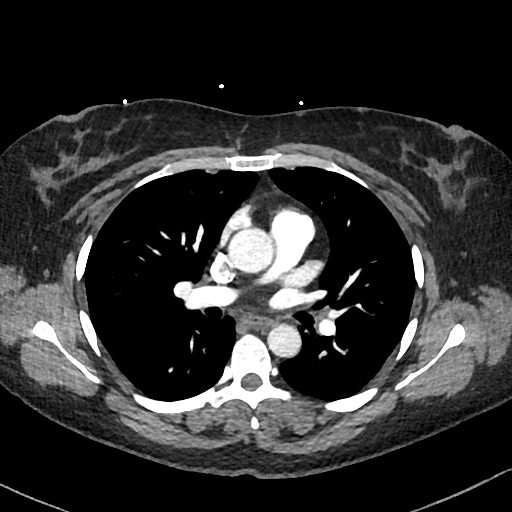
[im 202/358  lung]
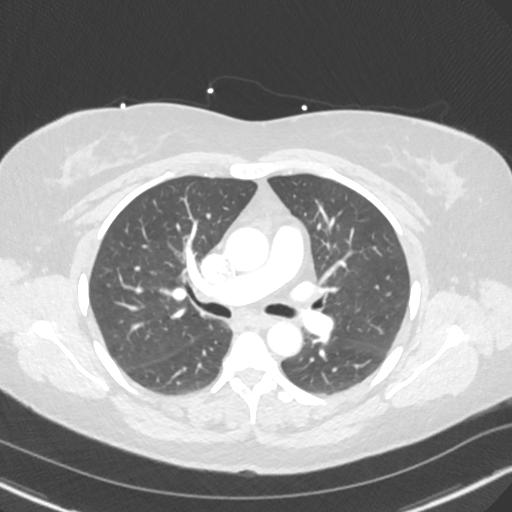
[im 218/358  soft-tissue]
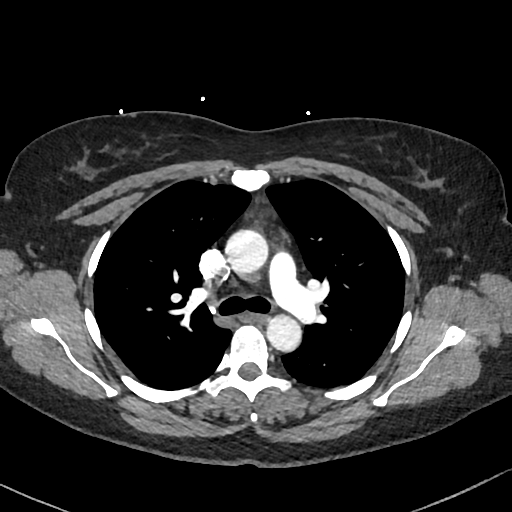
[im 249/358  lung]
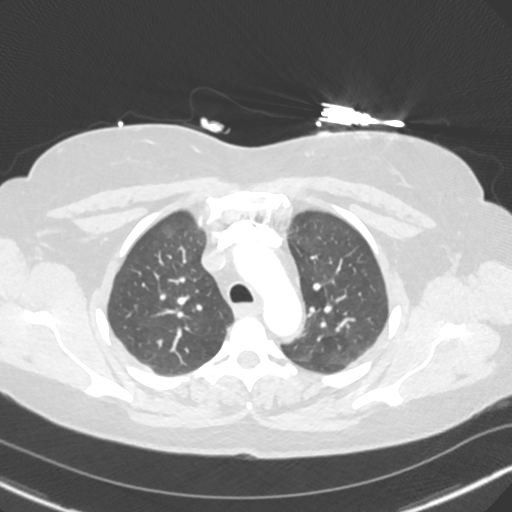
[im 264/358  soft-tissue]
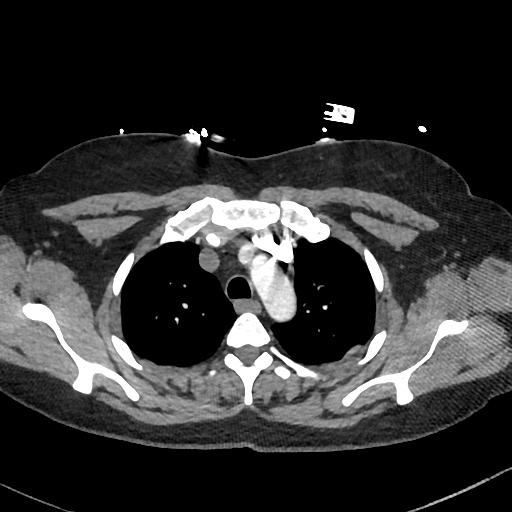
[im 295/358  lung]
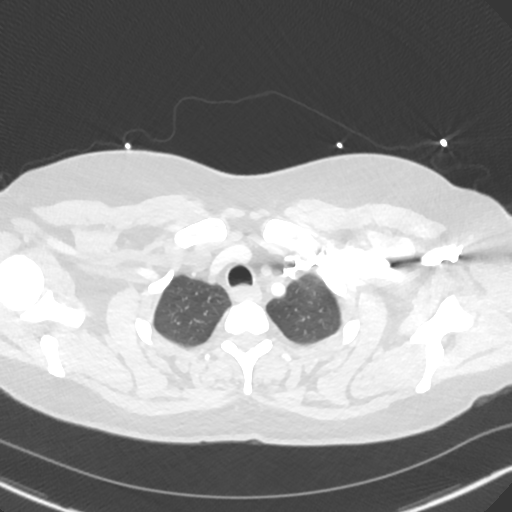
[im 311/358  soft-tissue]
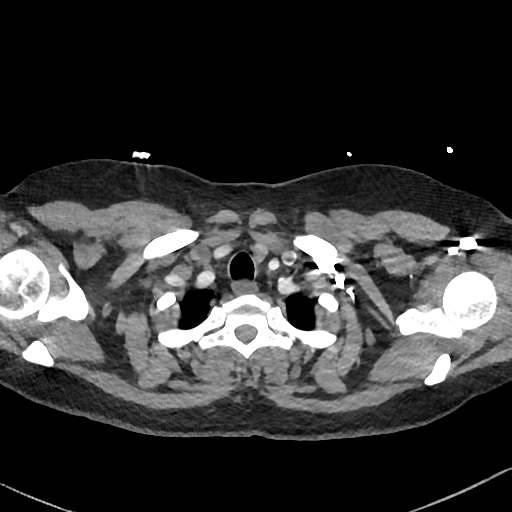
[im 342/358  lung]
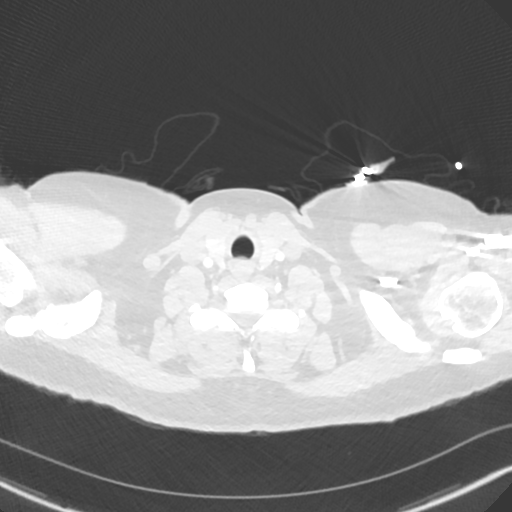

[Series 7: cor · coronal · 0.52mm/px · 3 of 142 slices shown]
[im 36/142  soft-tissue]
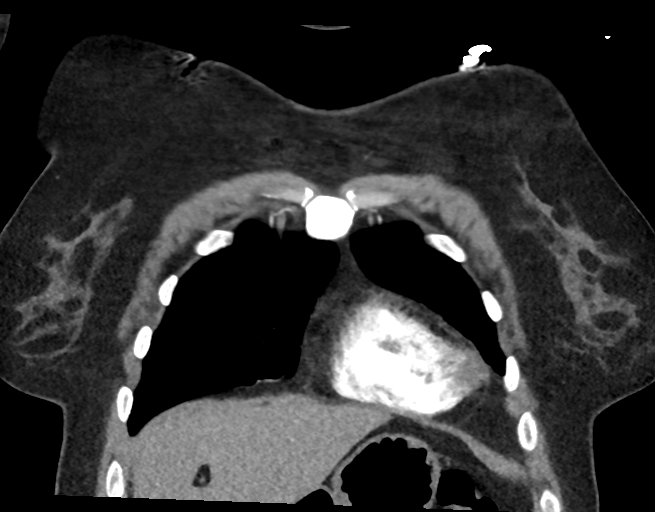
[im 71/142  soft-tissue]
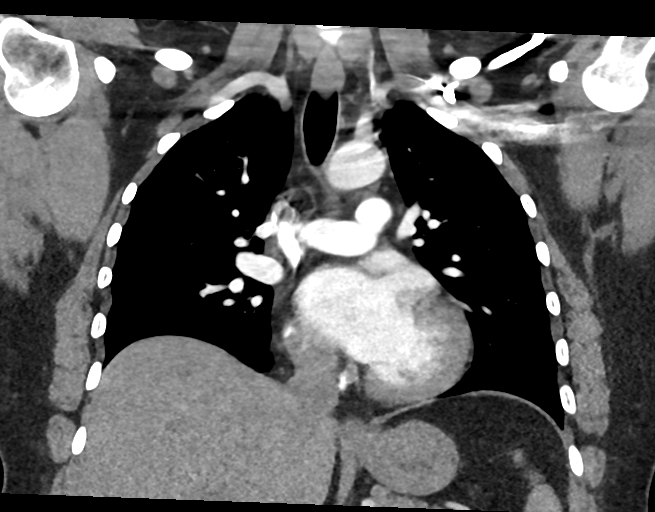
[im 106/142  soft-tissue]
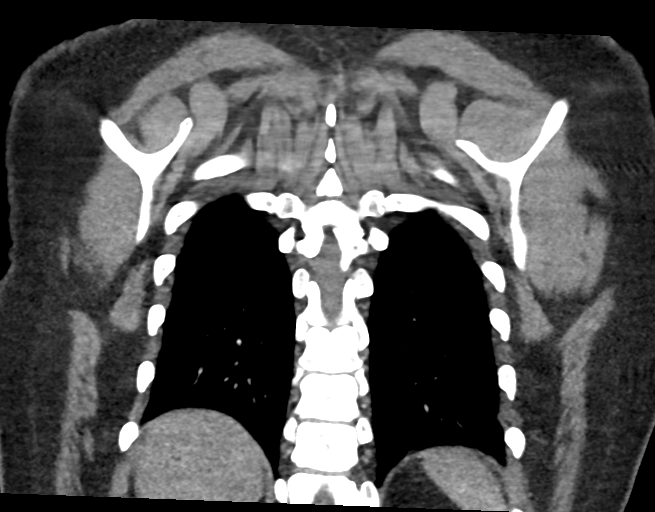

[18 of 46 positions shown; findings below may reference images not displayed]

RADIATION DOSE REDUCTION: This exam was performed according to the
departmental dose-optimization program which includes automated
exposure control, adjustment of the mA and/or kV according to
patient size and/or use of iterative reconstruction technique.

CONTRAST:  50mL OMNIPAQUE IOHEXOL 350 MG/ML SOLN
FINDINGS: Cardiovascular: There are no filling defects within the pulmonary
arteries to suggest pulmonary embolus. Normal caliber thoracic aorta
without dissection or acute aortic findings. The heart is normal in
size. No pericardial effusion.

Mediastinum/Nodes: No mediastinal or hilar adenopathy. Minimal
residual thymus in the anterior mediastinum. No mediastinal mass. No
visualized thyroid nodule. Small hiatal hernia.

Lungs/Pleura: Minor subsegmental atelectasis in the right lower
lobe. The lungs are otherwise clear. No confluent consolidation or
pneumonia. No pleural effusion. No pulmonary mass or nodule.

Upper Abdomen: No acute or unexpected finding.

Musculoskeletal: There are no acute or suspicious osseous
abnormalities. No chest wall soft tissue abnormalities.

Review of the MIP images confirms the above findings.
IMPRESSION: 1. No pulmonary embolus or acute intrathoracic abnormality.
2. Small hiatal hernia.
